# Patient Record
Sex: Male | Born: 1990 | Hispanic: No | Marital: Single | Smoking: Former smoker
Health system: Southern US, Community
[De-identification: ages and names within clinical notes are randomized; demographics above are authoritative.]

## PROBLEM LIST (undated history)

## (undated) DIAGNOSIS — E161 Other hypoglycemia: Secondary | ICD-10-CM

## (undated) DIAGNOSIS — I639 Cerebral infarction, unspecified: Secondary | ICD-10-CM

## (undated) DIAGNOSIS — H548 Legal blindness, as defined in USA: Secondary | ICD-10-CM

## (undated) DIAGNOSIS — Q211 Atrial septal defect: Secondary | ICD-10-CM

## (undated) DIAGNOSIS — E7030 Albinism, unspecified: Secondary | ICD-10-CM

## (undated) DIAGNOSIS — H5500 Unspecified nystagmus: Secondary | ICD-10-CM

## (undated) HISTORY — DX: Albinism, unspecified: E70.30

## (undated) HISTORY — DX: Atrial septal defect: Q21.1

## (undated) HISTORY — DX: Unspecified nystagmus: H55.00

## (undated) HISTORY — DX: Legal blindness, as defined in USA: H54.8

## (undated) HISTORY — DX: Cerebral infarction, unspecified: I63.9

## (undated) HISTORY — PX: EYE SURGERY: SHX253

---
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
## 2010-08-25 ENCOUNTER — Emergency Department (HOSPITAL_COMMUNITY)
Admission: EM | Admit: 2010-08-25 | Discharge: 2010-08-25 | Disposition: A | Discharge status: Home / Self Care | Payer: Medicaid Other | Attending: Emergency Medicine | Admitting: Emergency Medicine

## 2010-08-25 DIAGNOSIS — W219XXA Striking against or struck by unspecified sports equipment, initial encounter: Secondary | ICD-10-CM | POA: Insufficient documentation

## 2010-08-25 DIAGNOSIS — S0003XA Contusion of scalp, initial encounter: Secondary | ICD-10-CM | POA: Insufficient documentation

## 2010-08-25 DIAGNOSIS — Y9367 Activity, basketball: Secondary | ICD-10-CM | POA: Insufficient documentation

## 2010-09-01 LAB — MONONUCLEOSIS SCREEN: Mono Screen: POSITIVE — AB

## 2010-09-01 LAB — RAPID STREP SCREEN (MED CTR MEBANE ONLY): Streptococcus, Group A Screen (Direct): NEGATIVE

## 2012-07-22 ENCOUNTER — Encounter (HOSPITAL_COMMUNITY): Payer: Self-pay | Admitting: *Deleted

## 2012-07-22 ENCOUNTER — Emergency Department (HOSPITAL_COMMUNITY): Payer: Medicaid Other

## 2012-07-22 ENCOUNTER — Emergency Department (HOSPITAL_COMMUNITY)
Admission: EM | Admit: 2012-07-22 | Discharge: 2012-07-22 | Disposition: A | Discharge status: Home / Self Care | Payer: Medicaid Other | Attending: Emergency Medicine | Admitting: Emergency Medicine

## 2012-07-22 DIAGNOSIS — Y9351 Activity, roller skating (inline) and skateboarding: Secondary | ICD-10-CM | POA: Insufficient documentation

## 2012-07-22 DIAGNOSIS — Z8639 Personal history of other endocrine, nutritional and metabolic disease: Secondary | ICD-10-CM | POA: Insufficient documentation

## 2012-07-22 DIAGNOSIS — Z862 Personal history of diseases of the blood and blood-forming organs and certain disorders involving the immune mechanism: Secondary | ICD-10-CM | POA: Insufficient documentation

## 2012-07-22 DIAGNOSIS — Y929 Unspecified place or not applicable: Secondary | ICD-10-CM | POA: Insufficient documentation

## 2012-07-22 DIAGNOSIS — S90129A Contusion of unspecified lesser toe(s) without damage to nail, initial encounter: Secondary | ICD-10-CM | POA: Insufficient documentation

## 2012-07-22 HISTORY — DX: Other hypoglycemia: E16.1

## 2012-07-22 NOTE — ED Notes (Signed)
Pt states he hurt his R foot yesterday skate boarding, bruising noted to inside of R foot.

## 2012-07-22 NOTE — ED Provider Notes (Signed)
Medical screening examination/treatment/procedure(s) were performed by non-physician practitioner and as supervising physician I was immediately available for consultation/collaboration.   Lynasia Meloche E Benito Lemmerman, MD 07/22/12 1956 

## 2012-07-22 NOTE — ED Provider Notes (Signed)
History    This chart was scribed for non-physician practitioner working with Suzi Roots, MD by Melba Coon, ED Scribe. This patient was seen in room WTR9/WTR9 and the patient's care was started at 3:41PM.     CSN: [id number]  Arrival date & time 07/22/12  1407   First MD Initiated Contact with Patient 07/22/12 1514      Chief Complaint  Patient presents with  . Foot Pain    (Consider location/radiation/quality/duration/timing/severity/associated sxs/prior treatment) The history is provided by the patient. No language interpreter was used.   Jeffrey Gilmore is a 22 y.o. male who presents to the Emergency Department complaining of constant, moderate to severe right toe  pain with an onset yesterday pertaining to a fall without head contact or LOC. He reports he fell skateboarding yesterday while trying to jump over 8 stairs; he tweaked his right leg with pain especially around his ankle. He reports all his weight landed on his foot. He is able to move his right ankle. Ambulation is with pain and is decreased compared to baseline. Denies HA, fever, neck pain, sore throat, rash, back pain, CP, SOB, abdominal pain, nausea, emesis, diarrhea, dysuria, or extremity weakness, numbness, or tingling. No known allergies. No other pertinent medical symptoms.  Past Medical History  Diagnosis Date  . Hypoglycemic reaction     Past Surgical History  Procedure Laterality Date  . Eye surgery      History reviewed. No pertinent family history.  History  Substance Use Topics  . Smoking status: Never Smoker   . Smokeless tobacco: Never Used  . Alcohol Use: Yes      Review of Systems 10 Systems reviewed and all are negative for acute change except as noted in the HPI.   Allergies  Amoxicillin  Home Medications   Current Outpatient Rx  Name  Route  Sig  Dispense  Refill  . glucosamine-chondroitin 500-400 MG tablet   Oral   Take 1 tablet by mouth 3 (three) times daily.          Marland Kitchen ibuprofen (ADVIL,MOTRIN) 200 MG tablet   Oral   Take 800 mg by mouth every 6 (six) hours as needed for pain.         . Multiple Vitamin (MULTIVITAMIN WITH MINERALS) TABS   Oral   Take 1 tablet by mouth daily.         . vitamin C (ASCORBIC ACID) 500 MG tablet   Oral   Take 500 mg by mouth daily.           BP 106/70  Pulse 76  Temp(Src) 98.5 F (36.9 C) (Oral)  Resp 18  SpO2 97%  Physical Exam  Nursing note and vitals reviewed. Constitutional: He is oriented to person, place, and time. He appears well-developed and well-nourished. No distress.  HENT:  Head: Normocephalic and atraumatic.  Eyes: EOM are normal.  Neck: Neck supple. No tracheal deviation present.  Cardiovascular: Normal rate.   Pulmonary/Chest: Effort normal. No respiratory distress.  Musculoskeletal: Normal range of motion. He exhibits tenderness.  Bruising to the right great toe without deformity but decreased ROM and mild TTP.  Neurological: He is alert and oriented to person, place, and time.  Skin: Skin is warm and dry.  Psychiatric: He has a normal mood and affect. His behavior is normal.    ED Course  Procedures (including critical care time)  DIAGNOSTIC STUDIES: Oxygen Saturation is 97% on room air, normal by my interpretation.  COORDINATION OF CARE:  3:46PM - Imaging results are reviewed and are unremarkable. Work note will be written for him to stay rest for 2 days and post op boot will be given today as well. He is advised to take ibuprofen at home and he will be ready for d/c.   Labs Reviewed - No data to display Dg Foot Complete Right  07/22/2012  *RADIOLOGY REPORT*  Clinical Data: Foot pain  RIGHT FOOT COMPLETE - 3+ VIEW  Comparison: 05/07/2007  Findings: No fracture or dislocation is seen.  The joint spaces are preserved.  Visualized osseous structures are within normal limits.  IMPRESSION: No evidence of acute cardiopulmonary disease.   Original Report Authenticated By: Charline Bills, M.D.      1. Toe contusion, right, initial encounter       MDM  I personally performed the services described in this documentation, which was scribed in my presence. The recorded information has been reviewed and is accurate.        Dorthula Matas, PA 07/22/12 1640

## 2012-09-30 ENCOUNTER — Encounter (HOSPITAL_COMMUNITY): Payer: Self-pay | Admitting: *Deleted

## 2012-09-30 ENCOUNTER — Emergency Department (HOSPITAL_COMMUNITY): Payer: Medicaid Other

## 2012-09-30 ENCOUNTER — Emergency Department (HOSPITAL_COMMUNITY)
Admission: EM | Admit: 2012-09-30 | Discharge: 2012-10-01 | Disposition: A | Discharge status: Home / Self Care | Payer: Medicaid Other | Attending: Emergency Medicine | Admitting: Emergency Medicine

## 2012-09-30 DIAGNOSIS — Y9351 Activity, roller skating (inline) and skateboarding: Secondary | ICD-10-CM | POA: Insufficient documentation

## 2012-09-30 DIAGNOSIS — Y92838 Other recreation area as the place of occurrence of the external cause: Secondary | ICD-10-CM | POA: Insufficient documentation

## 2012-09-30 DIAGNOSIS — Y9239 Other specified sports and athletic area as the place of occurrence of the external cause: Secondary | ICD-10-CM | POA: Insufficient documentation

## 2012-09-30 DIAGNOSIS — Z880 Allergy status to penicillin: Secondary | ICD-10-CM | POA: Insufficient documentation

## 2012-09-30 DIAGNOSIS — S92109A Unspecified fracture of unspecified talus, initial encounter for closed fracture: Secondary | ICD-10-CM | POA: Insufficient documentation

## 2012-09-30 DIAGNOSIS — Z79899 Other long term (current) drug therapy: Secondary | ICD-10-CM | POA: Insufficient documentation

## 2012-09-30 DIAGNOSIS — S92102A Unspecified fracture of left talus, initial encounter for closed fracture: Secondary | ICD-10-CM

## 2012-09-30 MED ORDER — IBUPROFEN 800 MG PO TABS
800.0000 mg | ORAL_TABLET | Freq: Once | ORAL | Status: AC
  Administered 2012-10-01: 800 mg via ORAL
  Filled 2012-09-30: qty 1

## 2012-09-30 NOTE — ED Notes (Signed)
Pt injured L ankle while skateboarding, presents w/ pain and swelling to L foot and L ankle.

## 2012-09-30 NOTE — ED Provider Notes (Signed)
History     CSN: [id number]  Arrival date & time 09/30/12  2248   First MD Initiated Contact with Patient 09/30/12 2309      Chief Complaint  Patient presents with  . Ankle Injury    (Consider location/radiation/quality/duration/timing/severity/associated sxs/prior treatment) HPI Comments: 22 y.o. Male with no significant medical hx presents today s/p skateboarding accident where he fell, hurting his left foot and left ankle. Pt states did not hit his head. Did not lose consciousness and reports no other somatic complaints. Pt states he was able to wt bear at the scene, went home, iced the injury, elevated it, took some ibuprofen and took a nap. When he awoke, the pain and swelling had increased and he wanted to have it xrayed. Pt states pain is severe, throbbing, worse with weight bearing. Pt denies numbness, nausea, weakness.   Patient is a 22 y.o. male presenting with lower extremity injury.  Ankle Injury Associated symptoms include joint swelling. Pertinent negatives include no numbness or weakness.    Past Medical History  Diagnosis Date  . Hypoglycemic reaction     Past Surgical History  Procedure Laterality Date  . Eye surgery      History reviewed. No pertinent family history.  History  Substance Use Topics  . Smoking status: Never Smoker   . Smokeless tobacco: Never Used  . Alcohol Use: Yes     Comment: weekly      Review of Systems  Musculoskeletal: Positive for joint swelling.  Neurological: Negative for weakness and numbness.    Allergies  Amoxicillin  Home Medications   Current Outpatient Rx  Name  Route  Sig  Dispense  Refill  . glucosamine-chondroitin 500-400 MG tablet   Oral   Take 1 tablet by mouth 3 (three) times daily.         Marland Kitchen ibuprofen (ADVIL,MOTRIN) 200 MG tablet   Oral   Take 800 mg by mouth every 6 (six) hours as needed for pain.         . Multiple Vitamin (MULTIVITAMIN WITH MINERALS) TABS   Oral   Take 1 tablet by mouth  daily.         . vitamin C (ASCORBIC ACID) 500 MG tablet   Oral   Take 500 mg by mouth daily.           BP 120/64  Pulse 80  Temp(Src) 98.4 F (36.9 C) (Oral)  Resp 16  Ht 6\' 1"  (1.854 m)  Wt 183 lb (83.008 kg)  BMI 24.15 kg/m2  SpO2 98%  Physical Exam  Nursing note and vitals reviewed. Constitutional: He is oriented to person, place, and time. He appears well-developed and well-nourished. No distress.  HENT:  Head: Normocephalic and atraumatic.  Eyes: EOM are normal. Pupils are equal, round, and reactive to light.  Neck: Normal range of motion. Neck supple.  No meningeal signs  Cardiovascular: Normal rate, regular rhythm and normal heart sounds.  Exam reveals no gallop and no friction rub.   No murmur heard. Pulmonary/Chest: Effort normal and breath sounds normal. No respiratory distress. He has no wheezes. He has no rales. He exhibits no tenderness.  Abdominal: Soft. Bowel sounds are normal. He exhibits no distension. There is no tenderness. There is no rebound and no guarding.  Musculoskeletal: Normal range of motion. He exhibits no edema and no tenderness.  5/5 strength with exception of left foot. Strength diminished secondary to pain. Swelling to lateral malleolus and lateral dorsal surface of left foot. No  erythema. No warmth. Tenderness to palpation.   Neurological: He is alert and oriented to person, place, and time. No cranial nerve deficit.  Skin: Skin is warm and dry. He is not diaphoretic. No erythema.  Psychiatric: He has a normal mood and affect.    ED Course  Procedures (including critical care time)  Labs Reviewed - No data to display Dg Ankle Complete Left  09/30/2012  *RADIOLOGY REPORT*  Clinical Data: Lateral foot and ankle pain and swelling.  LEFT ANKLE COMPLETE - 3+ VIEW  Comparison: None.  Findings: Soft tissue swelling overlies the lateral malleolus and lateral foot. There is a linear fragment along the anterior margin of the talus superiorly as  seen on the lateral view. Ankle mortise appears intact.  No additional fracture identified.  No dislocation.  IMPRESSION: Fracture along the anterosuperior margin of the talus.  Lateral soft tissue swelling.  If clinical symptoms suggest a more complex injury, can be further evaluated with cross-sectional imaging (CT or MR).   Original Report Authenticated By: Jearld Lesch, M.D.    Dg Foot Complete Left  09/30/2012  *RADIOLOGY REPORT*  Clinical Data: Ankle and foot injury, swelling laterally.  LEFT FOOT - COMPLETE 3+ VIEW  Comparison: None.  Findings: As described on contemporaneous ankle, there is a fracture along the anterior superior margin of the talus.  Lateral soft tissue swelling.  No additional fracture identified.  No dislocation.  IMPRESSION: Anterosuperior fracture with talus.   Original Report Authenticated By: Jearld Lesch, M.D.       1. Talus fracture, left, closed, initial encounter       MDM  PE reveals diffusely swollen left foot and ankle. Neurovascularly intact. Limited ROM. Imaging confirms fracture along the anterosuperior margin of the talus, consistent with point tenderness and swelling. Ankle mortise is intact. Discussed with pt that ortho tech needed to arrive from [facility], so the wait would be a while. Offered vicodin/percocet, but pt declined, preferring ibuprofen. Pt moved to Room 20, a more comfortable place for him to wait. Ortho tech consult appreciated. Posterior stirrup splint to be applied. Crutches to be provided. Pt understands diagnosis and is agreeable to discharge.        Glade Nurse, PA-C 10/01/12 2156

## 2012-10-01 MED ORDER — OXYCODONE-ACETAMINOPHEN 5-325 MG PO TABS: 1.0000 | ORAL_TABLET | ORAL | Status: DC | PRN

## 2012-10-01 NOTE — Progress Notes (Signed)
Orthopedic Tech Progress Note Patient Details:  Jeffrey Gilmore 04/22/1991 [id number]  Ortho Devices Type of Ortho Device: Stirrup splint;Post (short) splint   [name] 10/01/2012, 12:33 AM

## 2012-10-04 NOTE — ED Provider Notes (Signed)
Medical screening examination/treatment/procedure(s) were performed by non-physician practitioner and as supervising physician I was immediately available for consultation/collaboration.   Gilda Crease, MD 10/04/12 1700

## 2014-01-17 ENCOUNTER — Emergency Department (HOSPITAL_COMMUNITY)
Admission: EM | Admit: 2014-01-17 | Discharge: 2014-01-17 | Disposition: A | Discharge status: Home / Self Care | Payer: Medicaid Other | Attending: Emergency Medicine | Admitting: Emergency Medicine

## 2014-01-17 ENCOUNTER — Encounter (HOSPITAL_COMMUNITY): Payer: Self-pay | Admitting: Emergency Medicine

## 2014-01-17 DIAGNOSIS — Z79899 Other long term (current) drug therapy: Secondary | ICD-10-CM | POA: Insufficient documentation

## 2014-01-17 DIAGNOSIS — H5710 Ocular pain, unspecified eye: Secondary | ICD-10-CM | POA: Insufficient documentation

## 2014-01-17 DIAGNOSIS — H00023 Hordeolum internum right eye, unspecified eyelid: Secondary | ICD-10-CM

## 2014-01-17 DIAGNOSIS — Z862 Personal history of diseases of the blood and blood-forming organs and certain disorders involving the immune mechanism: Secondary | ICD-10-CM | POA: Diagnosis not present

## 2014-01-17 DIAGNOSIS — H00029 Hordeolum internum unspecified eye, unspecified eyelid: Secondary | ICD-10-CM | POA: Diagnosis not present

## 2014-01-17 DIAGNOSIS — Z8639 Personal history of other endocrine, nutritional and metabolic disease: Secondary | ICD-10-CM | POA: Insufficient documentation

## 2014-01-17 DIAGNOSIS — Z880 Allergy status to penicillin: Secondary | ICD-10-CM | POA: Insufficient documentation

## 2014-01-17 MED ORDER — ERYTHROMYCIN 5 MG/GM OP OINT
TOPICAL_OINTMENT | Freq: Once | OPHTHALMIC | Status: AC
  Administered 2014-01-17: 1 via OPHTHALMIC
  Filled 2014-01-17: qty 3.5

## 2014-01-17 NOTE — ED Notes (Signed)
Pt started having eye pain yesterday.  Pt woke up today and painful to blink.  Right eyelid swollen and painful.  No change in vision.

## 2014-01-17 NOTE — Discharge Instructions (Signed)
Warm compresses to the eye. Erythromycin ointment, apply 1cm ribbon to the lower eye lod 6 times a day. Return if worsening. Otherwise follow up with your eye doctor.    Sty A sty (hordeolum) is an infection of a gland in the eyelid located at the base of the eyelash. A sty may develop a white or yellow head of pus. It can be puffy (swollen). Usually, the sty will burst and pus will come out on its own. They do not leave lumps in the eyelid once they drain. A sty is often confused with another form of cyst of the eyelid called a chalazion. Chalazions occur within the eyelid and not on the edge where the bases of the eyelashes are. They often are red, sore and then form firm lumps in the eyelid. CAUSES   Germs (bacteria).  Lasting (chronic) eyelid inflammation. SYMPTOMS   Tenderness, redness and swelling along the edge of the eyelid at the base of the eyelashes.  Sometimes, there is a white or yellow head of pus. It may or may not drain. DIAGNOSIS  An ophthalmologist will be able to distinguish between a sty and a chalazion and treat the condition appropriately.  TREATMENT   Styes are typically treated with warm packs (compresses) until drainage occurs.  In rare cases, medicines that kill germs (antibiotics) may be prescribed. These antibiotics may be in the form of drops, cream or pills.  If a hard lump has formed, it is generally necessary to do a small incision and remove the hardened contents of the cyst in a minor surgical procedure done in the office.  In suspicious cases, your caregiver may send the contents of the cyst to the lab to be certain that it is not a rare, but dangerous form of cancer of the glands of the eyelid. HOME CARE INSTRUCTIONS   Wash your hands often and dry them with a clean towel. Avoid touching your eyelid. This may spread the infection to other parts of the eye.  Apply heat to your eyelid for 10 to 20 minutes, several times a day, to ease pain and help to  heal it faster.  Do not squeeze the sty. Allow it to drain on its own. Wash your eyelid carefully 3 to 4 times per day to remove any pus. SEEK IMMEDIATE MEDICAL CARE IF:   Your eye becomes painful or puffy (swollen).  Your vision changes.  Your sty does not drain by itself within 3 days.  Your sty comes back within a short period of time, even with treatment.  You have redness (inflammation) around the eye.  You have a fever. Document Released: 02/16/2005 Document Revised: 08/01/2011 Document Reviewed: 08/23/2013 [facility] Patient Information 2015 [place], [place]. This information is not intended to replace advice given to you by your health care provider. Make sure you discuss any questions you have with your health care provider.

## 2014-01-17 NOTE — ED Provider Notes (Signed)
CSN: [id number]     Arrival date & time 01/17/14  1256 History  This chart was scribed for non-physician practitioner, Jaynie Crumble, PA-C, working with Merrie Roof, *, by Jarvis Morgan, ED Scribe. This patient was seen in room WTR6/WTR6 and the patient's care was started at 2:37 PM.    Chief Complaint  Patient presents with  . Eye Pain      The history is provided by the patient. No language interpreter was used.   HPI Comments: Jeffrey Gilmore is a 23 y.o. male who presents to the Emergency Department complaining of constant, moderate, gradually worsening, "6/10", right eye pain for 2 days. Patient states he woke up and it was painful to move or open his eye. He admits some associated swelling of his right eyelid and mild eye discharge. He notes that pain is exacerbated by blinking. He reports that it feels like something may be in his eye. He admits he may have made it worse by rubbing his eye. He denies any visual disturbance, fever, or URI symptoms.   Past Medical History  Diagnosis Date  . Hypoglycemic reaction    Past Surgical History  Procedure Laterality Date  . Eye surgery     History reviewed. No pertinent family history. History  Substance Use Topics  . Smoking status: Never Smoker   . Smokeless tobacco: Never Used  . Alcohol Use: Yes     Comment: weekly    Review of Systems  Constitutional: Negative for fever.  HENT: Negative for congestion, ear pain, sneezing and sore throat.   Eyes: Positive for pain, discharge and redness. Negative for visual disturbance.       Swelling of right eye lid  Respiratory: Negative for cough.   All other systems reviewed and are negative.     Allergies  Amoxicillin  Home Medications   Prior to Admission medications   Medication Sig Start Date End Date Taking? Authorizing Provider  ibuprofen (ADVIL,MOTRIN) 200 MG tablet Take 800 mg by mouth every 6 (six) hours as needed for pain.    Historical Provider, MD   Multiple Vitamin (MULTIVITAMIN WITH MINERALS) TABS Take 1 tablet by mouth daily.    Historical Provider, MD  oxyCODONE-acetaminophen (PERCOCET) 5-325 MG per tablet Take 1 tablet by mouth every 4 (four) hours as needed for pain. 10/01/12   Glade Nurse, PA-C  vitamin C (ASCORBIC ACID) 500 MG tablet Take 500 mg by mouth daily.    Historical Provider, MD   Triage Vitals: BP 107/65  Pulse 68  Temp(Src) 98.3 F (36.8 C) (Oral)  Resp 16  SpO2 93%  Physical Exam  Nursing note and vitals reviewed. Constitutional: He is oriented to person, place, and time. He appears well-developed and well-nourished. No distress.  HENT:  Head: Normocephalic and atraumatic.  Eyes: Conjunctivae and EOM are normal. Pupils are equal, round, and reactive to light.  Mild swelling to right eyelid, mainly over lateral aspect of the lid. Small pustule to the inverted lateral lid. Conjunctiva normal  Neck: Normal range of motion. Neck supple. No tracheal deviation present.  Cardiovascular: Normal rate.   Pulmonary/Chest: Effort normal. No respiratory distress.  Musculoskeletal: Normal range of motion.  Neurological: He is alert and oriented to person, place, and time.  Skin: Skin is warm and dry.  Psychiatric: He has a normal mood and affect. His behavior is normal.    ED Course  Procedures (including critical care time)  DIAGNOSTIC STUDIES: Oxygen Saturation is 93% on RA, adequate  by my interpretation.    COORDINATION OF CARE: 2:54 PM- Advised pt to return if symptoms or swelling get worse. Advised him to f/u with eye doctor. Will discharge pt with erythromycin opthalmic ointment.  Pt advised of plan for treatment and pt agrees.     Labs Review Labs Reviewed - No data to display  Imaging Review No results found.   EKG Interpretation None      MDM   Final diagnoses:  None   Pt with swelling of lateral eyelid, most likely due to a hordeolum. conjunctiva normal. Home with warm compresses,  erythromycin ointment. Follow up with his ophthalmologist.    Ceasar Mons Vitals:   01/17/14 1316 01/17/14 1522  BP: 107/65 108/74  Pulse: 68 67  Temp: 98.3 F (36.8 C)   TempSrc: Oral   Resp: 16 17  SpO2: 93% 100%   I personally performed the services described in this documentation, which was scribed in my presence. The recorded information has been reviewed and is accurate.    Lottie Mussel, PA-C 01/17/14 1735

## 2014-01-17 NOTE — ED Notes (Signed)
Given abx eye ointment and given specific instructions to call opthalmology today for appt set up. AVS explained in detail. No other questions/concerns.

## 2014-01-21 NOTE — ED Provider Notes (Signed)
Medical screening examination/treatment/procedure(s) were performed by non-physician practitioner and as supervising physician I was immediately available for consultation/collaboration.   EKG Interpretation None        Kennedy Bohanon David Torion Hulgan III, MD 01/21/14 1708 

## 2014-07-23 ENCOUNTER — Encounter (HOSPITAL_COMMUNITY): Payer: Self-pay

## 2014-07-23 ENCOUNTER — Emergency Department (HOSPITAL_COMMUNITY)
Admission: EM | Admit: 2014-07-23 | Discharge: 2014-07-23 | Disposition: A | Discharge status: Home / Self Care | Payer: Medicaid Other | Attending: Emergency Medicine | Admitting: Emergency Medicine

## 2014-07-23 ENCOUNTER — Emergency Department (HOSPITAL_COMMUNITY): Payer: Medicaid Other

## 2014-07-23 DIAGNOSIS — J069 Acute upper respiratory infection, unspecified: Secondary | ICD-10-CM | POA: Insufficient documentation

## 2014-07-23 DIAGNOSIS — R112 Nausea with vomiting, unspecified: Secondary | ICD-10-CM | POA: Diagnosis not present

## 2014-07-23 DIAGNOSIS — Z8639 Personal history of other endocrine, nutritional and metabolic disease: Secondary | ICD-10-CM | POA: Insufficient documentation

## 2014-07-23 DIAGNOSIS — Z880 Allergy status to penicillin: Secondary | ICD-10-CM | POA: Insufficient documentation

## 2014-07-23 DIAGNOSIS — R51 Headache: Secondary | ICD-10-CM | POA: Diagnosis not present

## 2014-07-23 DIAGNOSIS — R52 Pain, unspecified: Secondary | ICD-10-CM | POA: Diagnosis present

## 2014-07-23 MED ORDER — PROMETHAZINE-DM 6.25-15 MG/5ML PO SYRP: 5.0000 mL | ORAL_SOLUTION | Freq: Four times a day (QID) | ORAL | Status: DC | PRN

## 2014-07-23 MED ORDER — DOXYCYCLINE HYCLATE 100 MG PO CAPS: 100.0000 mg | ORAL_CAPSULE | Freq: Two times a day (BID) | ORAL | Status: DC

## 2014-07-23 MED ORDER — GUAIFENESIN ER 1200 MG PO TB12: 1.0000 | ORAL_TABLET | Freq: Two times a day (BID) | ORAL | Status: DC

## 2014-07-23 MED ORDER — ONDANSETRON HCL 4 MG/2ML IJ SOLN
4.0000 mg | Freq: Once | INTRAMUSCULAR | Status: AC
  Administered 2014-07-23: 4 mg via INTRAVENOUS
  Filled 2014-07-23: qty 2

## 2014-07-23 MED ORDER — SODIUM CHLORIDE 0.9 % IV BOLUS (SEPSIS)
1000.0000 mL | Freq: Once | INTRAVENOUS | Status: AC
  Administered 2014-07-23: 1000 mL via INTRAVENOUS

## 2014-07-23 MED ORDER — KETOROLAC TROMETHAMINE 30 MG/ML IJ SOLN
30.0000 mg | Freq: Once | INTRAMUSCULAR | Status: AC
  Administered 2014-07-23: 30 mg via INTRAVENOUS
  Filled 2014-07-23: qty 1

## 2014-07-23 NOTE — Discharge Instructions (Signed)
Your chest x-ray did not show any pneumonia.  However, we are giving you some antibiotics.  Return here as needed.  Increase your fluid intake and rest as much as possible

## 2014-07-23 NOTE — ED Provider Notes (Signed)
CSN: [id number]     Arrival date & time 07/23/14  1405 History   First MD Initiated Contact with Patient 07/23/14 1437     Chief Complaint  Patient presents with  . Generalized Body Aches  . Headache   Patient is a 24 y.o. male presenting with headaches. The history is provided by the patient. No language interpreter was used.  Headache Associated symptoms: cough, myalgias, nausea, photophobia and vomiting    This chart was scribed for non-physician practitioner Ebbie Ridgehris Aiman Sonn, PA-C, working with Toy BakerAnthony T Allen, MD, by Andrew Auaven Small, ED Scribe. This patient was seen in room WTR9/WTR9 and the patient's care was started at 2:39 PM.  Jeffrey Gilmore is a 24 y.o. male who presents to the Emergency Department complaining of generalized body aches. Pt reports associated nausea with some emesis, chest discomfort, HA, photophobia and cough. Pt has had sick contacts including both parents who were seen here 4 days ago. Pt had been caring for parents while they were sick. Pt denies chronic medical problems.  Past Medical History  Diagnosis Date  . Hypoglycemic reaction    Past Surgical History  Procedure Laterality Date  . Eye surgery     History reviewed. No pertinent family history. History  Substance Use Topics  . Smoking status: Never Smoker   . Smokeless tobacco: Never Used  . Alcohol Use: Yes     Comment: weekly    Review of Systems  Eyes: Positive for photophobia.  Respiratory: Positive for cough.   Gastrointestinal: Positive for nausea and vomiting.  Musculoskeletal: Positive for myalgias.  Neurological: Positive for headaches.   Allergies  Amoxicillin  Home Medications   Prior to Admission medications   Medication Sig Start Date End Date Taking? Authorizing Provider  ibuprofen (ADVIL,MOTRIN) 200 MG tablet Take 800 mg by mouth every 6 (six) hours as needed for pain.    Historical Provider, MD  Multiple Vitamin (MULTIVITAMIN WITH MINERALS) TABS Take 1 tablet by mouth daily.     Historical Provider, MD  OVER THE COUNTER MEDICATION Apply 3 drops to eye once as needed (dry eyes.).    Historical Provider, MD  vitamin C (ASCORBIC ACID) 500 MG tablet Take 500 mg by mouth daily.    Historical Provider, MD   BP 120/62 mmHg  Pulse 100  Temp(Src) 98.3 F (36.8 C) (Oral)  Resp 21  SpO2 96% Physical Exam  Constitutional: He is oriented to person, place, and time. He appears well-developed and well-nourished. No distress.  HENT:  Head: Normocephalic and atraumatic.  Mouth/Throat: Oropharynx is clear and moist.  Eyes: Conjunctivae are normal. Pupils are equal, round, and reactive to light.  Neck: Normal range of motion. Neck supple.  Cardiovascular: Normal rate, regular rhythm and normal heart sounds.   Pulmonary/Chest: Effort normal and breath sounds normal. No respiratory distress.  Musculoskeletal: Normal range of motion.  Neurological: He is alert and oriented to person, place, and time.  Skin: Skin is warm and dry.  Psychiatric: He has a normal mood and affect. His behavior is normal.  Nursing note and vitals reviewed.   ED Course  Procedures (including critical care time) DIAGNOSTIC STUDIES: Oxygen Saturation is 96% on RA, normal by my interpretation.    COORDINATION OF CARE: 2:57 PM- Pt advised of plan for treatment and pt agrees.  Patient be treated for a upper respiratory infection.  Told to follow-up with his primary care doctor Imaging Review Dg Chest 2 View  07/23/2014   CLINICAL DATA:  Generalized body  aches. Nausea, vomiting, chest discomfort, cough.  EXAM: CHEST  2 VIEW  COMPARISON:  07/25/2003  FINDINGS: The heart size and mediastinal contours are within normal limits. Both lungs are clear. The visualized skeletal structures are unremarkable.  IMPRESSION: No active cardiopulmonary disease.   Electronically Signed   By: Elige Ko   On: 07/23/2014 15:38    Told to increase his fluid intake and rest as much as possible  Carlyle Dolly,  PA-C 07/23/14 1553  Richardean Canal, MD 07/23/14 1755

## 2014-07-23 NOTE — ED Notes (Signed)
Mother and father both dx with pneumonia in last week.  Pt has cough with nausea and body aches since this morning

## 2014-09-28 ENCOUNTER — Encounter (HOSPITAL_COMMUNITY): Payer: Self-pay

## 2014-09-28 ENCOUNTER — Inpatient Hospital Stay (HOSPITAL_COMMUNITY)
Admission: EM | Admit: 2014-09-28 | Discharge: 2014-09-30 | DRG: 065 | Disposition: A | Discharge status: Home / Self Care | Payer: Medicaid Other | Attending: Internal Medicine | Admitting: Internal Medicine

## 2014-09-28 ENCOUNTER — Emergency Department (HOSPITAL_COMMUNITY): Admission: EM | Admit: 2014-09-28 | Payer: Medicaid Other | Source: Home / Self Care

## 2014-09-28 DIAGNOSIS — G459 Transient cerebral ischemic attack, unspecified: Secondary | ICD-10-CM

## 2014-09-28 DIAGNOSIS — I639 Cerebral infarction, unspecified: Principal | ICD-10-CM | POA: Diagnosis present

## 2014-09-28 DIAGNOSIS — F1290 Cannabis use, unspecified, uncomplicated: Secondary | ICD-10-CM | POA: Diagnosis present

## 2014-09-28 DIAGNOSIS — I63411 Cerebral infarction due to embolism of right middle cerebral artery: Secondary | ICD-10-CM

## 2014-09-28 DIAGNOSIS — R29810 Facial weakness: Secondary | ICD-10-CM | POA: Diagnosis present

## 2014-09-28 DIAGNOSIS — E785 Hyperlipidemia, unspecified: Secondary | ICD-10-CM | POA: Insufficient documentation

## 2014-09-28 DIAGNOSIS — R200 Anesthesia of skin: Secondary | ICD-10-CM | POA: Insufficient documentation

## 2014-09-28 DIAGNOSIS — R4781 Slurred speech: Secondary | ICD-10-CM | POA: Diagnosis present

## 2014-09-28 DIAGNOSIS — Q211 Atrial septal defect: Secondary | ICD-10-CM

## 2014-09-28 DIAGNOSIS — F1210 Cannabis abuse, uncomplicated: Secondary | ICD-10-CM | POA: Insufficient documentation

## 2014-09-28 DIAGNOSIS — E7030 Albinism, unspecified: Secondary | ICD-10-CM | POA: Diagnosis present

## 2014-09-28 NOTE — ED Provider Notes (Signed)
CSN: [id number]     Arrival date & time 09/28/14  2143 History   First MD Initiated Contact with Patient 09/28/14 2213     Chief Complaint  Patient presents with  . Hand numbness      (Consider location/radiation/quality/duration/timing/severity/associated sxs/prior Treatment) HPI Patient awakened 8:45 PM tonight from a nap feeling numb in his left hand and having difficulty moving his left hand and also had slurring of his speech. He felt well upon laying down for his nap at 7:45 PM Symptoms lasted approximately one hour, resolved spontaneously. He is presently asymptomatic. No treatment prior to coming here. He admits to smoking marijuana earlier today. Nothing made symptoms better or worse. No other associated symptoms. Past Medical History  Diagnosis Date  . Hypoglycemic reaction    Past Surgical History  Procedure Laterality Date  . Eye surgery     No family history on file.  History  Substance Use Topics  . Smoking status: Never Smoker   . Smokeless tobacco: Never Used  . Alcohol Use: Yes     Comment: occasionally    smokes marijuana,."VAPES", does not smoke tobacco  Review of Systems  Constitutional: Negative.   HENT: Negative.   Eyes:       Chronic nystagmus  Respiratory: Negative.   Cardiovascular: Negative.   Gastrointestinal: Negative.   Musculoskeletal: Negative.   Skin: Negative.   Neurological: Positive for numbness.       Numbness around mouth and at left hand, resolved  Psychiatric/Behavioral: Negative.   All other systems reviewed and are negative.     Allergies  Amoxicillin  Home Medications   Prior to Admission medications   Medication Sig Start Date End Date Taking? Authorizing Provider  ibuprofen (ADVIL,MOTRIN) 200 MG tablet Take 800 mg by mouth every 6 (six) hours as needed for pain.   Yes Historical Provider, MD  doxycycline (VIBRAMYCIN) 100 MG capsule Take 1 capsule (100 mg total) by mouth 2 (two) times daily. Patient not taking: Reported  on 09/28/2014 07/23/14   Charlestine Nighthristopher Lawyer, PA-C  Guaifenesin 1200 MG TB12 Take 1 tablet (1,200 mg total) by mouth 2 (two) times daily. Patient not taking: Reported on 09/28/2014 07/23/14   Charlestine Nighthristopher Lawyer, PA-C  promethazine-dextromethorphan (PROMETHAZINE-DM) 6.25-15 MG/5ML syrup Take 5 mLs by mouth 4 (four) times daily as needed for cough. Patient not taking: Reported on 09/28/2014 07/23/14   Charlestine Nighthristopher Lawyer, PA-C   BP 137/65 mmHg  Pulse 68  Temp(Src) 97.8 F (36.6 C) (Oral)  Resp 18  SpO2 98% Physical Exam  Constitutional: He appears well-developed and well-nourished.  HENT:  Head: Normocephalic and atraumatic.  Right Ear: External ear normal.  Left Ear: External ear normal.  No facial asymmetry  Eyes: Conjunctivae are normal. Pupils are equal, round, and reactive to light.  Horizontal nystagmus  Neck: Neck supple. No tracheal deviation present. No thyromegaly present.  No bruit  Cardiovascular: Normal rate and regular rhythm.  Exam reveals no friction rub.   No murmur heard. Pulmonary/Chest: Effort normal and breath sounds normal.  Abdominal: Soft. Bowel sounds are normal. He exhibits no distension. There is no tenderness.  Musculoskeletal: Normal range of motion. He exhibits no edema or tenderness.  Neurological: He is alert. No cranial nerve deficit. Coordination normal.  Gait normal Romberg normal pronator drift normal finger to nose normal DTR symmetric bilaterally knee jerk ankle jerk biceps toes or going bilaterally  Skin: Skin is warm and dry. No rash noted.  Psychiatric: He has a normal mood and affect.  Nursing note and vitals reviewed.   ED Course  Procedures (including critical care time) Labs Review Labs Reviewed - No data to display  Imaging Review No results found.   EKG Interpretation None      MDM  Patient has no risk factors for stroke. MRI scan of brain is ordered. [facility] by his mother, to be  reevaluated by emergency physician.Dr. Juleen ChinaKohut is accepting MD .the patient asymptomatic on transfer Final diagnoses:  None   dx numbness and weakness of left hand with dysarthria  Differntial VH:QIONGEXBx:Migraine equivalent, TIA      Doug SouSam Rosia Syme, MD 09/28/14 2313

## 2014-09-28 NOTE — ED Notes (Signed)
Pt presents with family with c/o left hand numbness that started approx a hour and a half ago. Pt reports that around this time, he was unable to feel his left hand although he was attempting to make a fist. Pt reports he felt like his mouth was numb and his lips were not moving but he was attempting to talk. Pt has no deficits at this time, hand numbness has resolved as well. Alert and oriented, talking in complete sentences.

## 2014-09-28 NOTE — ED Notes (Signed)
Pt asymptomatic at present time. Family remains with pt.

## 2014-09-29 ENCOUNTER — Emergency Department (HOSPITAL_COMMUNITY): Payer: Medicaid Other

## 2014-09-29 ENCOUNTER — Inpatient Hospital Stay (HOSPITAL_COMMUNITY): Payer: Medicaid Other

## 2014-09-29 ENCOUNTER — Ambulatory Visit (HOSPITAL_COMMUNITY): Payer: Medicaid Other

## 2014-09-29 ENCOUNTER — Encounter (HOSPITAL_COMMUNITY): Payer: Self-pay | Admitting: Internal Medicine

## 2014-09-29 DIAGNOSIS — R200 Anesthesia of skin: Secondary | ICD-10-CM | POA: Diagnosis not present

## 2014-09-29 DIAGNOSIS — F1290 Cannabis use, unspecified, uncomplicated: Secondary | ICD-10-CM | POA: Diagnosis present

## 2014-09-29 DIAGNOSIS — I639 Cerebral infarction, unspecified: Secondary | ICD-10-CM | POA: Diagnosis present

## 2014-09-29 DIAGNOSIS — E785 Hyperlipidemia, unspecified: Secondary | ICD-10-CM | POA: Diagnosis present

## 2014-09-29 DIAGNOSIS — I63411 Cerebral infarction due to embolism of right middle cerebral artery: Secondary | ICD-10-CM | POA: Diagnosis not present

## 2014-09-29 DIAGNOSIS — F1210 Cannabis abuse, uncomplicated: Secondary | ICD-10-CM | POA: Diagnosis not present

## 2014-09-29 DIAGNOSIS — R29810 Facial weakness: Secondary | ICD-10-CM | POA: Diagnosis present

## 2014-09-29 DIAGNOSIS — E7030 Albinism, unspecified: Secondary | ICD-10-CM | POA: Diagnosis present

## 2014-09-29 DIAGNOSIS — R4781 Slurred speech: Secondary | ICD-10-CM | POA: Diagnosis present

## 2014-09-29 DIAGNOSIS — Q211 Atrial septal defect: Secondary | ICD-10-CM | POA: Diagnosis not present

## 2014-09-29 LAB — BASIC METABOLIC PANEL
Anion gap: 8 (ref 5–15)
BUN: 12 mg/dL (ref 6–20)
CALCIUM: 9.2 mg/dL (ref 8.9–10.3)
CO2: 25 mmol/L (ref 22–32)
CREATININE: 0.99 mg/dL (ref 0.61–1.24)
Chloride: 105 mmol/L (ref 101–111)
Glucose, Bld: 92 mg/dL (ref 70–99)
POTASSIUM: 3.8 mmol/L (ref 3.5–5.1)
Sodium: 138 mmol/L (ref 135–145)

## 2014-09-29 LAB — RAPID URINE DRUG SCREEN, HOSP PERFORMED
Amphetamines: NOT DETECTED
Barbiturates: NOT DETECTED
Benzodiazepines: NOT DETECTED
COCAINE: NOT DETECTED
Opiates: NOT DETECTED
TETRAHYDROCANNABINOL: NOT DETECTED

## 2014-09-29 LAB — CBC WITH DIFFERENTIAL/PLATELET
BASOS ABS: 0.1 10*3/uL (ref 0.0–0.1)
BASOS PCT: 1 % (ref 0–1)
EOS ABS: 0.4 10*3/uL (ref 0.0–0.7)
Eosinophils Relative: 5 % (ref 0–5)
HCT: 41.8 % (ref 39.0–52.0)
Hemoglobin: 13.9 g/dL (ref 13.0–17.0)
Lymphocytes Relative: 27 % (ref 12–46)
Lymphs Abs: 2.2 10*3/uL (ref 0.7–4.0)
MCH: 30.8 pg (ref 26.0–34.0)
MCHC: 33.3 g/dL (ref 30.0–36.0)
MCV: 92.5 fL (ref 78.0–100.0)
MONO ABS: 0.8 10*3/uL (ref 0.1–1.0)
Monocytes Relative: 10 % (ref 3–12)
NEUTROS ABS: 4.6 10*3/uL (ref 1.7–7.7)
NEUTROS PCT: 57 % (ref 43–77)
PLATELETS: 212 10*3/uL (ref 150–400)
RBC: 4.52 MIL/uL (ref 4.22–5.81)
RDW: 12.9 % (ref 11.5–15.5)
WBC: 8.1 10*3/uL (ref 4.0–10.5)

## 2014-09-29 LAB — ANTITHROMBIN III: ANTITHROMB III FUNC: 93 % (ref 75–120)

## 2014-09-29 LAB — SEDIMENTATION RATE: Sed Rate: 2 mm/hr (ref 0–16)

## 2014-09-29 MED ORDER — ASPIRIN 300 MG RE SUPP: 300.0000 mg | Freq: Every day | RECTAL | Status: DC

## 2014-09-29 MED ORDER — IOHEXOL 350 MG/ML SOLN
50.0000 mL | Freq: Once | INTRAVENOUS | Status: AC | PRN
  Administered 2014-09-29: 50 mL via INTRAVENOUS

## 2014-09-29 MED ORDER — STROKE: EARLY STAGES OF RECOVERY BOOK
Freq: Once | Status: AC
  Administered 2014-09-29: 09:00:00

## 2014-09-29 MED ORDER — ASPIRIN 325 MG PO TABS
325.0000 mg | ORAL_TABLET | Freq: Every day | ORAL | Status: DC
  Administered 2014-09-29: 325 mg via ORAL
  Filled 2014-09-29: qty 1

## 2014-09-29 MED ORDER — SENNOSIDES-DOCUSATE SODIUM 8.6-50 MG PO TABS: 1.0000 | ORAL_TABLET | Freq: Every evening | ORAL | Status: DC | PRN

## 2014-09-29 MED ORDER — SODIUM CHLORIDE 0.9 % IV SOLN
INTRAVENOUS | Status: DC
  Administered 2014-09-29: 23:00:00 via INTRAVENOUS

## 2014-09-29 MED ORDER — SODIUM CHLORIDE 0.9 % IV SOLN: INTRAVENOUS | Status: AC

## 2014-09-29 MED ORDER — ENOXAPARIN SODIUM 30 MG/0.3ML ~~LOC~~ SOLN
30.0000 mg | SUBCUTANEOUS | Status: DC
  Administered 2014-09-29: 30 mg via SUBCUTANEOUS
  Filled 2014-09-29: qty 0.3

## 2014-09-29 MED ORDER — ASPIRIN EC 81 MG PO TBEC: 81.0000 mg | DELAYED_RELEASE_TABLET | Freq: Every day | ORAL | Status: DC

## 2014-09-29 MED ORDER — ENOXAPARIN SODIUM 30 MG/0.3ML ~~LOC~~ SOLN: 30.0000 mg | SUBCUTANEOUS | Status: DC

## 2014-09-29 NOTE — ED Provider Notes (Signed)
CSN: [id number]     Arrival date & time    History   None   This chart was scribed for Mirian MoMatthew Gentry, MD by Arlan OrganAshley Leger, ED Scribe. This patient was seen in room C29C/C29C and the patient's care was started 12:04 AM.    No chief complaint on file.  The history is provided by the patient. No language interpreter was used.    HPI Comments: Gregary Signsory D Wuthrich is a 24 y.o. 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 Pt states he woke up from a nap earlier this evening and went to speak to his brother in the living room. Pt states he then noticed numbness to his L hand. When he went to grab a cup of water, he immediately dropped the cup as if he did not have a grip on the cup. Mother states when she came to pick him up she noticed a difference in his speech and the L sided facial droop. He denies any arm or leg weakness during episode. No previous history of same. Pt states he has not returned back to baseline and both symptoms have now resolved. PSHx includes eye surgery which resulted in some changes in his vision. Pt with known allergy to Amoxicillin.  Past Medical History  Diagnosis Date  . Hypoglycemic reaction    Past Surgical History  Procedure Laterality Date  . Eye surgery    . Tee without cardioversion N/A 09/30/2014    Procedure: TRANSESOPHAGEAL ECHOCARDIOGRAM (TEE);  Surgeon: Wendall StadePeter C Nishan, MD;  [facility];  Service: Cardiovascular;  Laterality: N/A;   No family history on file. History  Substance Use Topics  . Smoking status: Never Smoker   . Smokeless tobacco: Never Used  . Alcohol Use: Yes     Comment: occasionally     Review of Systems  Constitutional: Negative for fever and chills.  Skin: Negative for rash.  Neurological: Positive for weakness and numbness.  Psychiatric/Behavioral: Negative  for confusion.  All other systems reviewed and are negative.     Allergies  Amoxicillin  Home Medications   Prior to Admission medications   Medication Sig Start Date End Date Taking? Authorizing Provider  aspirin EC 81 MG EC tablet Take 1 tablet (81 mg total) by mouth daily. 09/30/14   Maryann Mikhail, DO  senna-docusate (SENOKOT-S) 8.6-50 MG per tablet Take 1 tablet by mouth at bedtime as needed for mild constipation. 09/30/14   Maryann Mikhail, DO  simvastatin (ZOCOR) 10 MG tablet Take 1 tablet (10 mg total) by mouth daily at 6 PM. 09/30/14   Maryann Mikhail, DO   Triage Vitals: BP 104/49 mmHg  Pulse 61  Temp(Src) 97.7 F (36.5 C) (Oral)  Resp 16  SpO2 97%  Physical Exam  Constitutional: He is oriented to person, place, and time. He appears well-developed and well-nourished.  HENT:  Head: Normocephalic and atraumatic.  Eyes: Conjunctivae and EOM are normal.  Neck: Normal range of motion. Neck supple.  Cardiovascular: Normal rate, regular rhythm and normal heart sounds.   Pulmonary/Chest: Effort normal and breath sounds normal. No respiratory distress.  Abdominal: He exhibits no distension. There is no tenderness. There is no rebound and no guarding.  Musculoskeletal: Normal range of motion.  Neurological: He is alert and oriented to person, place, and time. He has normal strength. No cranial  nerve deficit or sensory deficit. Coordination normal.  Skin: Skin is warm and dry.  Vitals reviewed.   ED Course  Procedures (including critical care time)  DIAGNOSTIC STUDIES: Oxygen Saturation is 97% on RA, adequate by my interpretation.    COORDINATION OF CARE: 12:19 AM-Discussed treatment plan with pt at bedside and pt agreed to plan.     Labs Review Labs Reviewed - No data to display  Imaging Review No results found.   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  On my assumption of care, pt without symptoms.  MR with new CVA.  Consulted neurology and pt admitted.    I have reviewed all laboratory and imaging studies if ordered as above  1. Weakness           Mirian MoMatthew Gentry, MD 10/06/14 [phone]

## 2014-09-29 NOTE — Progress Notes (Signed)
STROKE TEAM PROGRESS NOTE   HISTORY Jeffrey Gilmore is an 24 y.o. male with a past medical history significant for albinism and hypoglycemic reaction, who was in his usual state of health until 9 pm yesterday 09/28/2014 when suddenly developed difficulty speaking, face-mouth numbness (can not pinpoint if left or right side) and weakness of the left hand. He said that he woke up from a nap and later on developed such symptoms. He became very scared and went to his mother room who noticed that his face was drooped and couldn't speak fluently. Mother said that he last saw him normal around 2 pm 09/28/2014 when he left the house to meet some friends and he told her that he smoked some marijuana. His symptoms lasted approximately 45 minutes and completely resolved. Denies recent head or neck trauma, HA, vertigo, double vision, difficulty swallowing, unsteadiness, confusion, or vision impairment. MRI brain was reviewed and demonstrated a small acute infarction involving the cortical gray matter and underlying white matter of the right frontal lobe. Patient was not administered TPA secondary to being out of the window, symptoms resolved. He was admitted for further evaluation and treatment.   SUBJECTIVE (INTERVAL HISTORY) His mother and aunt are at the bedside.  Overall he feels his condition is stable. Reports drinking power drinks, red bull, but not to excess. Not over 1 per day. Admits to marijuana use. Denies other drug use. Mom stated that he went to a party smoking marijuana, came back had a nap, after the nap he started has symptoms lasting 45 minutes. [name], he started to have headache, 6/10, throbbing, denies any nausea vomiting. Headache resolved.   OBJECTIVE Temp:  [97.5 F (36.4 C)-98.5 F (36.9 C)] 97.9 F (36.6 C) (05/09 0800) Pulse Rate:  [53-69] 64 (05/09 1000) Cardiac Rhythm:  [-]  Resp:  [16-18] 16 (05/09 1000) BP: (90-137)/(39-71) 107/56 mmHg (05/09 1000) SpO2:  [94 %-99 %]  95 % (05/09 1000)  No results for input(s): GLUCAP in the last 168 hours.  Recent Labs Lab 09/29/14 0426  NA 138  K 3.8  CL 105  CO2 25  GLUCOSE 92  BUN 12  CREATININE 0.99  CALCIUM 9.2   No results for input(s): AST, ALT, ALKPHOS, BILITOT, PROT, ALBUMIN in the last 168 hours.  Recent Labs Lab 09/29/14 0426  WBC 8.1  NEUTROABS 4.6  HGB 13.9  HCT 41.8  MCV 92.5  PLT 212   No results for input(s): CKTOTAL, CKMB, CKMBINDEX, TROPONINI in the last 168 hours. No results for input(s): LABPROT, INR in the last 72 hours. No results for input(s): COLORURINE, LABSPEC, PHURINE, GLUCOSEU, HGBUR, BILIRUBINUR, KETONESUR, PROTEINUR, UROBILINOGEN, NITRITE, LEUKOCYTESUR in the last 72 hours.  Invalid input(s): APPERANCEUR  No results found for: CHOL, TRIG, HDL, CHOLHDL, VLDL, LDLCALC No results found for: HGBA1C No results found for: LABOPIA, COCAINSCRNUR, LABBENZ, AMPHETMU, THCU, LABBARB  No results for input(s): ETH in the last 168 hours.   Dg Chest 2 View 09/29/2014   No active cardiopulmonary disease.     Mr Brain Wo Contrast 09/29/2014   1. 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 No associated hemorrhage or mass effect. 2. Otherwise normal brain MRI.   Ct Angio Head and neck W/cm &/or Wo Cm 09/29/2014   IMPRESSION: Right frontal lobe infarct better demonstrated on recent MR.  No medium or large size vessel significant stenosis. No obvious  changes of vasculitis (subtle branch vessel vasculitis changes may not be detected by CT angiography).  Slightly prominent vessels adjacent to the cavernous sinus region greater on right may be related to the late phase of injection rather than vascular malformation. To help confirm this and exclude underlying vascular malformation, MR angiogram and MR venography can be obtained.  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 Prominent lymphoid tissue may be reactive in origin related to recent infection. More worrisome process such as lymphoma felt to be unlikely in this setting of normal CBC and differential.     LE venous Doppler - negative for DVT  2D echo - pending  TEE pending  Cerebral angio - pending  PHYSICAL EXAM  Temp:  [97.5 F (36.4 C)-98.5 F (36.9 C)] 98.1 F (36.7 C) (05/09 1200) Pulse Rate:  [53-77] 77 (05/09 1200) Resp:  [16-18] 16 (05/09 1200) BP: (90-137)/(39-73) 112/73 mmHg (05/09 1200) SpO2:  [94 %-99 %] 97 % (05/09 1200)  General - Well nourished, well developed, in no apparent distress.  Ophthalmologic - Sharp disc margins OU.   Cardiovascular - Regular rate and rhythm with no murmur.  Mental Status -  Level of arousal and orientation to time, place, and person were intact. Language including expression, naming, repetition, comprehension was assessed and found intact. Attention span and concentration were normal. Recent and remote memory were intact. Fund of Knowledge was assessed and was intact.  Cranial Nerves II - XII - II - Visual field intact OU. III, IV, VI - Extraocular movements intact. V - Facial sensation intact bilaterally. VII - Facial movement intact bilaterally. VIII - Hearing & vestibular intact bilaterally. X - Palate elevates symmetrically. XI - Chin turning & shoulder shrug intact bilaterally. XII - Tongue protrusion intact.  Motor Strength - The patient's strength was normal in all extremities and pronator drift was absent.  Bulk was normal and fasciculations were absent.   Motor Tone - Muscle tone was assessed at the neck and appendages and was normal.  Reflexes - The patient's reflexes were 1+ in all extremities and he had no pathological reflexes.  Sensory - Light touch, temperature/pinprick, vibration and proprioception, and Romberg testing were assessed and were symmetrical.    Coordination - The patient had normal movements in the  hands and feet with no ataxia or dysmetria.  Tremor was absent.  Gait and Station - deferred due to fatigue.   ASSESSMENT/PLAN Jeffrey Gilmore is a 24 y.o. male with history of albinism and hypoglycemic reaction presenting with transient dysarthria, left face numbness-weakness, left hand weakness. He did not receive IV t-PA due to delay in arrival, symptoms resolved.   Stroke:  MRI read as Non-dominant right frontal lobe linear-shaped infarct, etiology unclear, DDx includes cardioembolic, paradoxical emboli, RVCS, vasculitis, substance related.  Resultant  Neuro deficits resolved  MRI  Read as R frontal lobe linear-shaped  infarct  2D Echo  pending   LE venous doppler no DVT  UDS negative   Ordered Cerebral angiogram to rule out AV fistula or vasculitis  Ordered Hypercoagulable, vasculitic and array of labs to look for possible stroke source  TEE to look for embolic source. Arranged with [facility] for tomorrow.  (I have made patient NPO after midnight tonight).   LDL pending   HgbA1c pending  Lovenox 30 mg sq daily for VTE prophylaxis Diet regular Room service appropriate?: Yes; Fluid consistency:: Thin  no antithrombotic prior to admission, now on aspirin 325  mg orally every day.   Ongoing aggressive stroke risk factor management  Therapy recommendations:  Pending. No needs anticipated  Disposition:  Return home  Other Stroke Risk Factors  ETOH use  Marijuana use.   UDS negative  Hospital day # 0  Jeffrey Gilmore,Jeffrey Gilmore  [facility] See Amion for Pager information 09/29/2014 12:35 PM   I, the attending vascular neurologist, have personally obtained a history, examined the patient, evaluated laboratory data, individually viewed imaging studies and agree with radiology interpretations. I also obtained additional history from pt's mom at bedside. I also discussed with Dr. Catha GosselinMikhail regarding his care plan. Together with the NP/PA, we  formulated the assessment and plan of care which reflects our mutual decision.  I have made any additions or clarifications directly to the above note and agree with the findings and plan as currently documented.   A 24 year old male with no significant past medical history presented with transient slurred speech, left hand and face numbness and weakness. Symptoms resolved. MRI showed right frontal linear-shaped infarct. Patient stated that he used marijuana before the episode, however UDS negative so far. Due to young age, would like to have full workup including TEE, cerebral angiogram, hypercoagulable and autoimmune workup. DDX include cardioembolic, paradoxical emboli, RVCS, vasculitis and substance related. Continue aspirin.  Jeffrey PlanJindong Ezio Wieck, MD PhD Stroke Neurology 09/29/2014 4:16 PM    To contact Stroke Continuity provider, please refer to [email or web address]. After hours, contact General Neurology

## 2014-09-29 NOTE — Consult Note (Signed)
Referring Physician: ED    Chief Complaint: transient dysarthria, left face numbness-weakness, left hand weakness, stroke on MRI  HPI:                                                                                                                                         Jeffrey Amberory D Fraser Dinixon is an 24 y.o. male with a past medical history significant for albinism and hypoglycemic reaction, who was in his usual state of health until 9 pm yesterday when suddenly developed difficulty speaking, face-mouth numbness (can not pinpoint if left or right side) and weakness of the left hand. He said that he woke up from a nap and later on developed such symptoms. He became very scared and went to his mother room who noticed that his face was drooped and couldn't speak fluently. Mother said that he last saw him normal around 2 pm when he left the house to meet some friends and he told her that he smoke some marihuana. His symptoms lasted approximately 45 minutes and completely resolved. Denies recent head or neck trauma, HA, vertigo, double vision, difficulty swallowing, unsteadiness, confusion, or vision impairment. MRI brain was personally reviewed and demonstrated a small acute infarction involving the cortical gray matter and underlying white matter of the right frontal lobe.  Date last known well: 09/28/14 Time last known well: 2 pm  tPA Given: no, out of the window, symptoms resolved   Past Medical History  Diagnosis Date  . Hypoglycemic reaction     Past Surgical History  Procedure Laterality Date  . Eye surgery      No family history on file. Social History:  reports that he has never smoked. He has never used smokeless tobacco. He reports that he drinks alcohol. He reports that he uses illicit drugs (Marijuana). Family history: no epilepsy, MS, brain tumors, stroke at young age, or brain aneurysms. Allergies:  Allergies  Allergen Reactions  . Amoxicillin Swelling and Rash    Medications:                                                                                                                            I have reviewed the patient's current medications.  ROS:  History obtained from the patient and mother  General ROS: negative for - chills, fatigue, fever, night sweats, weight gain or weight loss Psychological ROS: negative for - behavioral disorder, hallucinations, memory difficulties, mood swings or suicidal ideation Ophthalmic ROS: negative for - blurry vision, double vision, eye pain or loss of vision ENT ROS: negative for - epistaxis, nasal discharge, oral lesions, sore throat, tinnitus or vertigo Allergy and Immunology ROS: negative for - hives or itchy/watery eyes Hematological and Lymphatic ROS: negative for - bleeding problems, bruising or swollen lymph nodes Endocrine ROS: negative for - galactorrhea, hair pattern changes, polydipsia/polyuria or temperature intolerance Respiratory ROS: negative for - cough, hemoptysis, shortness of breath or wheezing Cardiovascular ROS: negative for - chest pain, dyspnea on exertion, edema or irregular heartbeat Gastrointestinal ROS: negative for - abdominal pain, diarrhea, hematemesis, nausea/vomiting or stool incontinence Genito-Urinary ROS: negative for - dysuria, hematuria, incontinence or urinary frequency/urgency Musculoskeletal ROS: negative for - joint swelling or muscular weakness Neurological ROS: as noted in HPI Dermatological ROS: negative for rash and skin lesion changes  Physical exam: pleasant male in no apparent distress. Blood pressure 97/55, pulse 67, temperature 97.5 F (36.4 C), temperature source Oral, resp. rate 16, SpO2 96 %. Head: normocephalic. Neck: supple, no bruits, no JVD. Cardiac: no murmurs. Lungs: clear. Abdomen: soft, no tender, no mass. Extremities: no edema. CV:  pulses palpable throughout  Skin: no rash  Neurologic Examination:                                                                                                      General: Mental Status: Alert, oriented, thought content appropriate.  Speech fluent without evidence of aphasia.  Able to follow 3 step commands without difficulty. Cranial Nerves: II: Discs flat bilaterally; Visual fields grossly normal, pupils equal, round, reactive to light and accommodation III,IV, VI: ptosis not present, multidirectional congenital nystagmus. V,VII: smile symmetric, facial light touch sensation normal bilaterally VIII: hearing normal bilaterally IX,X: uvula rises symmetrically XI: bilateral shoulder shrug XII: midline tongue extension without atrophy or fasciculations Motor: Right : Upper extremity   5/5    Left:     Upper extremity   5/5  Lower extremity   5/5     Lower extremity   5/5 Tone and bulk:normal tone throughout; no atrophy noted Sensory: Pinprick and light touch intact throughout, bilaterally Deep Tendon Reflexes:  Right: Upper Extremity   Left: Upper extremity   biceps (C-5 to C-6) 2/4   biceps (C-5 to C-6) 2/4 tricep (C7) 2/4    triceps (C7) 2/4 Brachioradialis (C6) 2/4  Brachioradialis (C6) 2/4  Lower Extremity Lower Extremity  quadriceps (L-2 to L-4) 2/4   quadriceps (L-2 to L-4) 2/4 Achilles (S1) 2/4   Achilles (S1) 2/4  Plantars: Right: downgoing   Left: downgoing Cerebellar: normal finger-to-nose,  normal heel-to-shin test Gait:  No ataxia.    No results found for this or any previous visit (from the past 48 hour(s)). Mr Brain Wo Contrast  09/29/2014   CLINICAL DATA:  Initial evaluation for left hand and lip numbness. Now asymptomatic.  EXAM: MRI HEAD WITHOUT CONTRAST  TECHNIQUE: Multiplanar, multiecho pulse sequences of the brain and surrounding structures were obtained without intravenous contrast.  COMPARISON:  None.  FINDINGS: The CSF containing spaces are within  normal limits for patient age. No focal parenchymal signal abnormality is identified. No mass lesion, midline shift, or extra-axial fluid collection. Ventricles are normal in size without evidence of hydrocephalus.  There is a linear abnormal focus of restricted diffusion involving the cortical gray matter an subjacent white matter within the right frontal lobe, consistent with an acute ischemic infarct (series 3, image 36). This is seen on coronal DWI sequence on series 4, image 15). Overall, this infarct measures approximately 2 cm in length. Associated signal dropout seen on ADC map. No associated hemorrhage or significant mass effect. No other infarct.  The cervicomedullary junction is normal. Pituitary gland is within normal limits. Pituitary stalk is midline. The globes and optic nerves demonstrate a normal appearance with normal signal intensity. The  The bone marrow signal intensity is normal. Calvarium is intact. Visualized upper cervical spine is within normal limits.  Scalp soft tissues are unremarkable.  Mild mucosal thickening present within the maxillary sinuses and ethmoidal air cells. No air-fluid levels to suggest active sinus infection. No mastoid effusion. Inner ear structures normal.  IMPRESSION: 1. 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 No associated hemorrhage or mass effect. 2. Otherwise normal brain MRI.   Electronically Signed   By: Rise MuBenjamin  McClintock M.D.   On: 09/29/2014 03:21    Assessment: 24 y.o. male with acute transient of transient dysarthria, left face numbness-weakness, left hand weakness that lasted approximately 45 minutes. No recent head/neck trauma. Consumes marihuana but denies cocaine. MRI brain revealed a small acute linear area of infarction involving the cortical gray matter and underlying white matter of the right frontal lobe. Admit to medicine. TIA work  up. Aspirin after passing swallowing evaluation.  Stroke Risk Factors -none   Plan: 1. HgbA1c, fasting lipid panel 2. MRI, MRA  of the brain without contrast 3. Echocardiogram 4. Carotid dopplers 5. Prophylactic therapy-aspirin 6. Risk factor modification 7. Telemetry monitoring 8. Frequent neuro checks 9. PT/OT SLP (no needed at this moment)  Wyatt Portelasvaldo Camilo, MD [facility] [phone]  09/29/2014, 4:49 AM

## 2014-09-29 NOTE — Progress Notes (Signed)
Pt arrived alert and oriented. Oriented to unit and room. Call bell at bedside. Will continue to monitor. Gara KronerHayles, Renita Brocks M, RN

## 2014-09-29 NOTE — ED Notes (Signed)
Arrived back from MRI.  No complaints voiced at this time.  All symptoms resolved.  Encouraged to call for assistance as needed.

## 2014-09-29 NOTE — ED Notes (Signed)
[facility].

## 2014-09-29 NOTE — ED Notes (Signed)
Neurologist at the bedside at this time.

## 2014-09-29 NOTE — Progress Notes (Signed)
VASCULAR LAB PRELIMINARY  PRELIMINARY  PRELIMINARY  PRELIMINARY  Bilateral lower extremity venous Dopplers completed.    Preliminary report:  There is no DVT or SVT noted in the bilateral lower extremities.   Massimo Hartland, RVT 09/29/2014, 12:35 PM

## 2014-09-29 NOTE — Consult Note (Signed)
Chief Complaint: Chief Complaint  Patient presents with  . Hand numbness   small Rt frontal infarct  Referring Physician(s): Dr Roda Shutters  History of Present Illness: Jeffrey Gilmore is a 24 y.o. male   May 12/2014 Pt suffered speech difficulty; facial numbness and L hand weakness which resolved after approx 45 minutes Pt had been seen wnl 200 pm that afternoon; went out and smoked marijuana and awoke 900pm with these sxs Work up reveals MRI with small Rt frontal infarct Now request for cerebral arteriogram per Dr Roda Shutters request No continued sxs   Past Medical History  Diagnosis Date  . Hypoglycemic reaction     Past Surgical History  Procedure Laterality Date  . Eye surgery      Allergies: Amoxicillin  Medications: Prior to Admission medications   Medication Sig Start Date End Date Taking? Authorizing Provider  ibuprofen (ADVIL,MOTRIN) 200 MG tablet Take 800 mg by mouth every 6 (six) hours as needed for pain.   Yes Historical Provider, MD  doxycycline (VIBRAMYCIN) 100 MG capsule Take 1 capsule (100 mg total) by mouth 2 (two) times daily. Patient not taking: Reported on 09/28/2014 07/23/14   Charlestine Night, PA-C  Guaifenesin 1200 MG TB12 Take 1 tablet (1,200 mg total) by mouth 2 (two) times daily. Patient not taking: Reported on 09/28/2014 07/23/14   Charlestine Night, PA-C  promethazine-dextromethorphan (PROMETHAZINE-DM) 6.25-15 MG/5ML syrup Take 5 mLs by mouth 4 (four) times daily as needed for cough. Patient not taking: Reported on 09/28/2014 07/23/14   Charlestine Night, PA-C     History reviewed. No pertinent family history.  History   Social History  . Marital Status: Single    Spouse Name: N/A  . Number of Children: N/A  . Years of Education: N/A   Social History Main Topics  . Smoking status: Never Smoker   . Smokeless tobacco: Never Used  . Alcohol Use: Yes     Comment: occasionally   . Drug Use: Yes    Special: Marijuana  . Sexual Activity: Yes    Birth  Control/ Protection: None   Other Topics Concern  . None   Social History Narrative    Review of Systems: A 12 point ROS discussed and pertinent positives are indicated in the HPI above.  All other systems are negative.  Review of Systems  Constitutional: Negative for fever, activity change, appetite change and fatigue.  HENT: Negative for drooling, hearing loss, tinnitus and trouble swallowing.   Eyes: Negative for photophobia and visual disturbance.  Respiratory: Negative for cough and shortness of breath.   Cardiovascular: Negative for chest pain.  Gastrointestinal: Negative for abdominal pain.  Genitourinary: Negative for difficulty urinating.  Neurological: Negative for dizziness, tremors, seizures, syncope, facial asymmetry, speech difficulty, weakness, light-headedness, numbness and headaches.  Psychiatric/Behavioral: Negative for behavioral problems and confusion.     Vital Signs: BP 112/73 mmHg  Pulse 77  Temp(Src) 98.1 F (36.7 C) (Oral)  Resp 16  SpO2 97%  Physical Exam  Constitutional: He is oriented to person, place, and time. He appears well-nourished.  Cardiovascular: Normal rate, regular rhythm and normal heart sounds.   No murmur heard. Pulmonary/Chest: Effort normal and breath sounds normal. He has no wheezes.  Abdominal: Soft. Bowel sounds are normal. There is no tenderness.  Musculoskeletal: Normal range of motion.  Neurological: He is alert and oriented to person, place, and time.  Skin: Skin is warm and dry.  Psychiatric: He has a normal mood and affect. His behavior is  normal. Judgment and thought content normal.  Nursing note and vitals reviewed.   Mallampati Score:  MD Evaluation Airway: WNL Heart: WNL Abdomen: WNL Chest/ Lungs: WNL ASA  Classification: 2 Mallampati/Airway Score: Two  Imaging: Ct Angio Head W/cm &/or Wo Cm  09/29/2014   CLINICAL DATA:  24 year old male presenting with left hand numbness and left facial droop yesterday  which lasted for 45 minutes. Subsequent encounter.  EXAM: CT ANGIOGRAPHY HEAD AND NECK  TECHNIQUE: Multidetector CT imaging of the head and neck was performed using the standard protocol during bolus administration of intravenous contrast. Multiplanar CT image reconstructions and MIPs were obtained to evaluate the vascular anatomy. Carotid stenosis measurements (when applicable) are obtained utilizing NASCET criteria, using the distal internal carotid diameter as the denominator.  CONTRAST:  50mL OMNIPAQUE IOHEXOL 350 MG/ML SOLN  COMPARISON:  09/29/2014 MR.  FINDINGS: CT HEAD  Brain: Small right frontal lobe infarct better delineated on recent brain MR. no intracranial hemorrhage. No intracranial enhancing lesion.  Calvarium and skull base: Negative.  Paranasal sinuses: Maxillary sinus slightly polypoid mucosal thickening greater on the right.  Orbits: Negative.  CTA NECK  Aortic arch: 3 vessel  Right carotid system: No significant stenosis or irregularity.  Left carotid system: No significant stenosis or irregularity.  Vertebral arteries:No significant stenosis or irregularity.  Skeleton: Negative.  Other neck: Prominent soft tissue anterior superior mediastinum most likely thymus. Prominence of lymph nodes throughout the neck, largest left level 2 region with short axis dimension of 1.6 cm. Prominent Waldeyer's ring. This prominent lymphoid tissue may be reactive in origin related to recent infection. More worrisome process such as lymphoma felt to be less likely in this setting of normal CBC and differential.  CTA HEAD  Anterior circulation: No medium or large size vessel significant stenosis. Slightly prominent vessels adjacent to the cavernous sinus region greater on right may be related to the late phase of injection rather than vascular malformation.  Posterior circulation:  No significant stenosis  Venous sinuses: As above  Anatomic variants: As above  Delayed phase: As above  IMPRESSION: Right frontal lobe  infarct better demonstrated on recent MR.  No medium or large size vessel significant stenosis. No obvious changes of vasculitis (subtle branch vessel vasculitis changes may not be detected by CT angiography).  Slightly prominent vessels adjacent to the cavernous sinus region greater on right may be related to the late phase of injection rather than vascular malformation. To help confirm this and exclude underlying vascular malformation, MR angiogram and MR venography can be obtained.  Prominent soft tissue anterior superior mediastinum most likely thymus. Prominent lymphoid tissue may be reactive in origin related to recent infection. More worrisome process such as lymphoma felt to be unlikely in this setting of normal CBC and differential.   Electronically Signed   By: Lacy DuverneySteven  Olson M.D.   On: 09/29/2014 09:28   Dg Chest 2 View  09/29/2014   CLINICAL DATA:  Stroke  EXAM: CHEST  2 VIEW  COMPARISON:  07/23/2014  FINDINGS: Cardiomediastinal silhouette is stable. No acute infiltrate or pleural effusion. No pulmonary edema. Mild hyperinflation.  IMPRESSION: No active cardiopulmonary disease.   Electronically Signed   By: Natasha MeadLiviu  Pop M.D.   On: 09/29/2014 08:02   Ct Angio Neck W/cm &/or Wo/cm  09/29/2014   CLINICAL DATA:  24 year old male presenting with left hand numbness and left facial droop yesterday which lasted for 45 minutes. Subsequent encounter.  EXAM: CT ANGIOGRAPHY HEAD AND NECK  TECHNIQUE: Multidetector CT  imaging of the head and neck was performed using the standard protocol during bolus administration of intravenous contrast. Multiplanar CT image reconstructions and MIPs were obtained to evaluate the vascular anatomy. Carotid stenosis measurements (when applicable) are obtained utilizing NASCET criteria, using the distal internal carotid diameter as the denominator.  CONTRAST:  50mL OMNIPAQUE IOHEXOL 350 MG/ML SOLN  COMPARISON:  09/29/2014 MR.  FINDINGS: CT HEAD  Brain: Small right frontal lobe infarct  better delineated on recent brain MR. no intracranial hemorrhage. No intracranial enhancing lesion.  Calvarium and skull base: Negative.  Paranasal sinuses: Maxillary sinus slightly polypoid mucosal thickening greater on the right.  Orbits: Negative.  CTA NECK  Aortic arch: 3 vessel  Right carotid system: No significant stenosis or irregularity.  Left carotid system: No significant stenosis or irregularity.  Vertebral arteries:No significant stenosis or irregularity.  Skeleton: Negative.  Other neck: Prominent soft tissue anterior superior mediastinum most likely thymus. Prominence of lymph nodes throughout the neck, largest left level 2 region with short axis dimension of 1.6 cm. Prominent Waldeyer's ring. This prominent lymphoid tissue may be reactive in origin related to recent infection. More worrisome process such as lymphoma felt to be less likely in this setting of normal CBC and differential.  CTA HEAD  Anterior circulation: No medium or large size vessel significant stenosis. Slightly prominent vessels adjacent to the cavernous sinus region greater on right may be related to the late phase of injection rather than vascular malformation.  Posterior circulation:  No significant stenosis  Venous sinuses: As above  Anatomic variants: As above  Delayed phase: As above  IMPRESSION: Right frontal lobe infarct better demonstrated on recent MR.  No medium or large size vessel significant stenosis. No obvious changes of vasculitis (subtle branch vessel vasculitis changes may not be detected by CT angiography).  Slightly prominent vessels adjacent to the cavernous sinus region greater on right may be related to the late phase of injection rather than vascular malformation. To help confirm this and exclude underlying vascular malformation, MR angiogram and MR venography can be obtained.  Prominent soft tissue anterior superior mediastinum most likely thymus. Prominent lymphoid tissue may be reactive in origin related  to recent infection. More worrisome process such as lymphoma felt to be unlikely in this setting of normal CBC and differential.   Electronically Signed   By: Lacy DuverneySteven  Olson M.D.   On: 09/29/2014 09:28   Mr Brain Wo Contrast  09/29/2014   CLINICAL DATA:  Initial evaluation for left hand and lip numbness. Now asymptomatic.  EXAM: MRI HEAD WITHOUT CONTRAST  TECHNIQUE: Multiplanar, multiecho pulse sequences of the brain and surrounding structures were obtained without intravenous contrast.  COMPARISON:  None.  FINDINGS: The CSF containing spaces are within normal limits for patient age. No focal parenchymal signal abnormality is identified. No mass lesion, midline shift, or extra-axial fluid collection. Ventricles are normal in size without evidence of hydrocephalus.  There is a linear abnormal focus of restricted diffusion involving the cortical gray matter an subjacent white matter within the right frontal lobe, consistent with an acute ischemic infarct (series 3, image 36). This is seen on coronal DWI sequence on series 4, image 15). Overall, this infarct measures approximately 2 cm in length. Associated signal dropout seen on ADC map. No associated hemorrhage or significant mass effect. No other infarct.  The cervicomedullary junction is normal. Pituitary gland is within normal limits. Pituitary stalk is midline. The globes and optic nerves demonstrate a normal appearance with normal signal intensity.  The  The bone marrow signal intensity is normal. Calvarium is intact. Visualized upper cervical spine is within normal limits.  Scalp soft tissues are unremarkable.  Mild mucosal thickening present within the maxillary sinuses and ethmoidal air cells. No air-fluid levels to suggest active sinus infection. No mastoid effusion. Inner ear structures normal.  IMPRESSION: 1. 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  No associated hemorrhage or mass effect. 2. Otherwise normal brain MRI.   Electronically Signed   By: Rise Mu M.D.   On: 09/29/2014 03:21    Labs:  CBC:  Recent Labs  09/29/14 0426  WBC 8.1  HGB 13.9  HCT 41.8  PLT 212    COAGS: No results for input(s): INR, APTT in the last 8760 hours.  BMP:  Recent Labs  09/29/14 0426  NA 138  K 3.8  CL 105  CO2 25  GLUCOSE 92  BUN 12  CALCIUM 9.2  CREATININE 0.99  GFRNONAA >60  GFRAA >60    LIVER FUNCTION TESTS: No results for input(s): BILITOT, AST, ALT, ALKPHOS, PROT, ALBUMIN in the last 8760 hours.  TUMOR MARKERS: No results for input(s): AFPTM, CEA, CA199, CHROMGRNA in the last 8760 hours.  Assessment and Plan:  L hand weakness; facial numbness; speech difficulty 5/8 x 45 mins Resolved - no continued sxs MRI does reveal small Rt frontal infarct Now scheduled for cerebral arteriogram Risks and Benefits discussed with the patient including, but not limited to bleeding, infection, vascular injury, contrast induced renal failure, stroke or even death. All of the patient's questions were answered, patient is agreeable to proceed. Consent signed and in chart.  Thank you for this interesting consult.  I greatly enjoyed meeting [facility] and look forward to participating in their care.  Signed: Luvern Mcisaac A 09/29/2014, 12:57 PM   I spent a total of 40 Minutes    in face to face in clinical consultation, greater than 50% of which was counseling/coordinating care for cerebral arteriogram

## 2014-09-29 NOTE — H&P (Signed)
[facility] History and Physical  Jeffrey Signsory D Tibbs ZOX:[id number] DOB: [birth year] DOA: 09/28/2014  Referring physician: Dr.Gentry. PCP: Kaleen MaskELKINS,WILSON OLIVER, MD  Specialists: None.  Chief Complaint: Slurred speech and left hand decreased grip strength.  HPI: Jeffrey Gilmore is a 24 y.o. 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 Patient states he woke up from sleep at around 8 PM last evening when he started developing the symptoms. The whole episode lasted for 45 minutes and completely resolved. In the ER MRI of the brain shows features consistent with stroke and patient has been admitted for further management. Patient denies any weakness on the right side and presently he is having complete strength on the left side. Denies any visual symptoms difficulty swallowing. Denies any chest pain or shortness of breath.   Review of Systems: As presented in the history of presenting illness, rest negative.  Past Medical History  Diagnosis Date  . Hypoglycemic reaction    Past Surgical History  Procedure Laterality Date  . Eye surgery     Social History:  reports that he has never smoked. He has never used smokeless tobacco. He reports that he drinks alcohol. He reports that he uses illicit drugs (Marijuana). Where does patient live home. Can patient participate in ADLs? Yes.  Allergies  Allergen Reactions  . Amoxicillin Swelling and Rash    Family History: History reviewed. No pertinent family history. grandparents have diabetes mellitus and hypertension.   Prior to Admission medications   Medication Sig Start Date End Date Taking? Authorizing Provider  ibuprofen (ADVIL,MOTRIN) 200 MG tablet Take 800 mg by mouth every 6 (six) hours as needed for pain.   Yes Historical Provider, MD  doxycycline (VIBRAMYCIN) 100 MG capsule Take 1 capsule (100 mg total) by mouth 2 (two) times  daily. Patient not taking: Reported on 09/28/2014 07/23/14   Charlestine Nighthristopher Lawyer, PA-C  Guaifenesin 1200 MG TB12 Take 1 tablet (1,200 mg total) by mouth 2 (two) times daily. Patient not taking: Reported on 09/28/2014 07/23/14   Charlestine Nighthristopher Lawyer, PA-C  promethazine-dextromethorphan (PROMETHAZINE-DM) 6.25-15 MG/5ML syrup Take 5 mLs by mouth 4 (four) times daily as needed for cough. Patient not taking: Reported on 09/28/2014 07/23/14   Charlestine Nighthristopher Lawyer, PA-C    Physical Exam: Filed Vitals:   09/29/14 0345 09/29/14 0400 09/29/14 0511 09/29/14 0525  BP: 94/51 97/55  109/68  Pulse: 55 67  69  Temp:   98.5 F (36.9 C) 98.1 F (36.7 C)  TempSrc:   Oral Oral  Resp:    18  SpO2: 97% 96%  96%     General:  Well-developed and nourished.  Eyes: Anicteric no pallor.  ENT: No discharge from the ears eyes nose,.  Neck: No mass felt.  Cardiovascular: S1-S2. Heard.  Respiratory: No rhonchi or crepitations.  Abdomen: Soft nontender bowel sounds present.  Skin: No rash.  Musculoskeletal: No edema.  Psychiatric: Appears normal.  Neurologic: Alert awake oriented to time place and person. Moves all extremities 5 x 5. No facial asymmetry. Tongue is midline.  Labs on Admission:  Basic Metabolic Panel:  Recent Labs Lab 09/29/14 0426  NA 138  K 3.8  CL 105  CO2 25  GLUCOSE 92  BUN 12  CREATININE 0.99  CALCIUM 9.2   Liver Function Tests: No results for input(s): AST, ALT, ALKPHOS, BILITOT, PROT, ALBUMIN in the last 168 hours. No results for input(s): LIPASE, AMYLASE in the last  168 hours. No results for input(s): AMMONIA in the last 168 hours. CBC:  Recent Labs Lab 09/29/14 0426  WBC 8.1  NEUTROABS 4.6  HGB 13.9  HCT 41.8  MCV 92.5  PLT 212   Cardiac Enzymes: No results for input(s): CKTOTAL, CKMB, CKMBINDEX, TROPONINI in the last 168 hours.  BNP (last 3 results) No results for input(s): BNP in the last 8760 hours.  ProBNP (last 3 results) No results for input(s): PROBNP  in the last 8760 hours.  CBG: No results for input(s): GLUCAP in the last 168 hours.  Radiological Exams on Admission: Mr Brain Wo Contrast  09/29/2014   CLINICAL DATA:  Initial evaluation for left hand and lip numbness. Now asymptomatic.  EXAM: MRI HEAD WITHOUT CONTRAST  TECHNIQUE: Multiplanar, multiecho pulse sequences of the brain and surrounding structures were obtained without intravenous contrast.  COMPARISON:  None.  FINDINGS: The CSF containing spaces are within normal limits for patient age. No focal parenchymal signal abnormality is identified. No mass lesion, midline shift, or extra-axial fluid collection. Ventricles are normal in size without evidence of hydrocephalus.  There is a linear abnormal focus of restricted diffusion involving the cortical gray matter an subjacent white matter within the right frontal lobe, consistent with an acute ischemic infarct (series 3, image 36). This is seen on coronal DWI sequence on series 4, image 15). Overall, this infarct measures approximately 2 cm in length. Associated signal dropout seen on ADC map. No associated hemorrhage or significant mass effect. No other infarct.  The cervicomedullary junction is normal. Pituitary gland is within normal limits. Pituitary stalk is midline. The globes and optic nerves demonstrate a normal appearance with normal signal intensity. The  The bone marrow signal intensity is normal. Calvarium is intact. Visualized upper cervical spine is within normal limits.  Scalp soft tissues are unremarkable.  Mild mucosal thickening present within the maxillary sinuses and ethmoidal air cells. No air-fluid levels to suggest active sinus infection. No mastoid effusion. Inner ear structures normal.  IMPRESSION: 1. 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 No associated hemorrhage or mass effect. 2. Otherwise normal brain MRI.    Electronically Signed   By: Rise MuBenjamin  McClintock M.D.   On: 09/29/2014 03:21     Assessment/Plan Principal Problem:   CVA (cerebral infarction) Active Problems:   Stroke   1. Stroke - I have discussed with on-call neurologist Dr. Cyril Mourningamillo. Patient just had CT angiogram of the head and neck which is pending. EKG is pending. Posterior monitor in telemetry. Check 2-D echo, lipid panel and hemoglobin A1c. Get physical therapy consult. Aspirin. And further recommendations per neurologist. Patient is placed on neurochecks and swallow evaluation.   DVT Prophylaxis Lovenox.  Code Status: Full code.  Family Communication: Patient's mother.  Disposition Plan: Admit to inpatient.    Kevia Zaucha N. [facility] Pager [phone].  If 7PM-7AM, please contact night-coverage [email or web address] Password [email or web address] 09/29/2014, 6:50 AM

## 2014-09-29 NOTE — Progress Notes (Signed)
[facility]                                                                              Patient Demographics  Jeffrey Gilmore, is a 24 y.o. male, DOB - 1991/04/09, VHQ:[id number]  Admit date - 09/28/2014   Admitting Physician Eduard ClosArshad N Kakrakandy, MD  Outpatient Primary MD for the patient is Kaleen MaskELKINS,WILSON OLIVER, MD  LOS - 0   Chief Complaint  Patient presents with  . Hand numbness       HPI on 09/29/2014 by Dr. Midge MiniumArshad Kakrakandy Jeffrey Gilmore is a 24 y.o. 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 Patient states he woke up from sleep at around 8 PM last evening when he started developing the symptoms. The whole episode lasted for 45 minutes and completely resolved. In the ER MRI of the brain shows features consistent with stroke and patient has been admitted for further management. Patient denies any weakness on the right side and presently he is having complete strength on the left side. Denies any visual symptoms difficulty swallowing. Denies any chest pain or shortness of breath.   Assessment & Plan  Patient admitted earlier this morning, please refer to full H&P. Agree with current assessment and plan.  Acute CVA -MRI brain: 2 cm acute linear ischemic infarct involving cortical gray matter and underlying white matter, right frontal lobe -CTA head/neck: Right frontal lobe infarct, no medium or large size vessel stenosis, no vasculitis  -Echocardiogram: Pending -Carotid doppler: Pending -LDL pending -hemoglobin A1c pending -PT/OT consulted pending -Neurology consulted and appreciated -Continue Aspirin  -Urine drug screen also pending  Polysubstance abuse  -Patient does admit to using marijuana, counseled against drug abuse   Code Status: Full  Family Communication: Family at bedside  Disposition Plan: Admitted, pending CVA workup  Time Spent in minutes   30  minutes  Procedures   Consults   Neurology  DVT Prophylaxis  Lovenox  Lab Results  Component Value Date   PLT 212 09/29/2014    Medications  Scheduled Meds: . aspirin EC  81 mg Oral Daily  . aspirin  300 mg Rectal Daily   Or  . aspirin  325 mg Oral Daily  . enoxaparin (LOVENOX) injection  30 mg Subcutaneous Q24H   Continuous Infusions: . sodium chloride     PRN Meds:.senna-docusate  Antibiotics    Anti-infectives    None        Subjective:   Jeffrey Gilmore seen and examined today.   patient denies any pain. States that his lip and left hand numbness has actually subsided and resolved. Denies any headache, vision changes, dizziness, chest pain, shortness of breath, abdominal pain. Denies any recent illness. There is admit to marijuana use.   Objective:   Filed Vitals:   09/29/14 0511 09/29/14 0525 09/29/14 0800 09/29/14 1000  BP:  109/68 98/39 107/56  Pulse:  69 59 64  Temp: 98.5 F (36.9 C) 98.1 F (36.7 C) 97.9 F (36.6 C)   TempSrc: Oral Oral Oral   Resp:  18 16 16   SpO2:  96% 97% 95%  Wt Readings from Last 3 Encounters:  09/30/12 83.008 kg (183 lb)    No intake or output data in the 24 hours ending 09/29/14 1046  Exam  General: Well developed, well nourished, NAD, appears stated age  HEENT: NCAT, PERRLA, EOMI, Anicteic Sclera, mucous membranes moist.   Neck: Supple, no JVD, no masses  Cardiovascular: S1 S2 auscultated, no rubs, murmurs or gallops. Regular rate and rhythm.  Respiratory: Clear to auscultation bilaterally with equal chest rise  Abdomen: Soft, nontender, nondistended, + bowel sounds  Extremities: warm dry without cyanosis clubbing or edema  Neuro: AAOx3, cranial nerves grossly intact. Strength 5/5 in patient's upper and lower extremities bilaterally  Skin: Without rashes exudates or nodules, albino  Psych: Normal affect and demeanor with intact judgement and insight   Data Review   Micro Results No results found for  this or any previous visit (from the past 240 hour(s)).  Radiology Reports Ct Angio Head W/cm &/or Wo Cm  09/29/2014   CLINICAL DATA:  25 year old male presenting with left hand numbness and left facial droop yesterday which lasted for 45 minutes. Subsequent encounter.  EXAM: CT ANGIOGRAPHY HEAD AND NECK  TECHNIQUE: Multidetector CT imaging of the head and neck was performed using the standard protocol during bolus administration of intravenous contrast. Multiplanar CT image reconstructions and MIPs were obtained to evaluate the vascular anatomy. Carotid stenosis measurements (when applicable) are obtained utilizing NASCET criteria, using the distal internal carotid diameter as the denominator.  CONTRAST:  50mL OMNIPAQUE IOHEXOL 350 MG/ML SOLN  COMPARISON:  09/29/2014 MR.  FINDINGS: CT HEAD  Brain: Small right frontal lobe infarct better delineated on recent brain MR. no intracranial hemorrhage. No intracranial enhancing lesion.  Calvarium and skull base: Negative.  Paranasal sinuses: Maxillary sinus slightly polypoid mucosal thickening greater on the right.  Orbits: Negative.  CTA NECK  Aortic arch: 3 vessel  Right carotid system: No significant stenosis or irregularity.  Left carotid system: No significant stenosis or irregularity.  Vertebral arteries:No significant stenosis or irregularity.  Skeleton: Negative.  Other neck: Prominent soft tissue anterior superior mediastinum most likely thymus. Prominence of lymph nodes throughout the neck, largest left level 2 region with short axis dimension of 1.6 cm. Prominent Waldeyer's ring. This prominent lymphoid tissue may be reactive in origin related to recent infection. More worrisome process such as lymphoma felt to be less likely in this setting of normal CBC and differential.  CTA HEAD  Anterior circulation: No medium or large size vessel significant stenosis. Slightly prominent vessels adjacent to the cavernous sinus region greater on right may be related to  the late phase of injection rather than vascular malformation.  Posterior circulation:  No significant stenosis  Venous sinuses: As above  Anatomic variants: As above  Delayed phase: As above  IMPRESSION: Right frontal lobe infarct better demonstrated on recent MR.  No medium or large size vessel significant stenosis. No obvious changes of vasculitis (subtle branch vessel vasculitis changes may not be detected by CT angiography).  Slightly prominent vessels adjacent to the cavernous sinus region greater on right may be related to the late phase of injection rather than vascular malformation. To help confirm this and exclude underlying vascular malformation, MR angiogram and MR venography can be obtained.  Prominent soft tissue anterior superior mediastinum most likely thymus. Prominent lymphoid tissue may be reactive in origin related to recent infection. More worrisome process such as lymphoma felt to be unlikely in this setting of normal CBC and differential.   Electronically  Signed   By: Lacy Duverney M.D.   On: 09/29/2014 09:28   Dg Chest 2 View  09/29/2014   CLINICAL DATA:  Stroke  EXAM: CHEST  2 VIEW  COMPARISON:  07/23/2014  FINDINGS: Cardiomediastinal silhouette is stable. No acute infiltrate or pleural effusion. No pulmonary edema. Mild hyperinflation.  IMPRESSION: No active cardiopulmonary disease.   Electronically Signed   By: Natasha Mead M.D.   On: 09/29/2014 08:02   Ct Angio Neck W/cm &/or Wo/cm  09/29/2014   CLINICAL DATA:  24 year old male presenting with left hand numbness and left facial droop yesterday which lasted for 45 minutes. Subsequent encounter.  EXAM: CT ANGIOGRAPHY HEAD AND NECK  TECHNIQUE: Multidetector CT imaging of the head and neck was performed using the standard protocol during bolus administration of intravenous contrast. Multiplanar CT image reconstructions and MIPs were obtained to evaluate the vascular anatomy. Carotid stenosis measurements (when applicable) are obtained  utilizing NASCET criteria, using the distal internal carotid diameter as the denominator.  CONTRAST:  50mL OMNIPAQUE IOHEXOL 350 MG/ML SOLN  COMPARISON:  09/29/2014 MR.  FINDINGS: CT HEAD  Brain: Small right frontal lobe infarct better delineated on recent brain MR. no intracranial hemorrhage. No intracranial enhancing lesion.  Calvarium and skull base: Negative.  Paranasal sinuses: Maxillary sinus slightly polypoid mucosal thickening greater on the right.  Orbits: Negative.  CTA NECK  Aortic arch: 3 vessel  Right carotid system: No significant stenosis or irregularity.  Left carotid system: No significant stenosis or irregularity.  Vertebral arteries:No significant stenosis or irregularity.  Skeleton: Negative.  Other neck: Prominent soft tissue anterior superior mediastinum most likely thymus. Prominence of lymph nodes throughout the neck, largest left level 2 region with short axis dimension of 1.6 cm. Prominent Waldeyer's ring. This prominent lymphoid tissue may be reactive in origin related to recent infection. More worrisome process such as lymphoma felt to be less likely in this setting of normal CBC and differential.  CTA HEAD  Anterior circulation: No medium or large size vessel significant stenosis. Slightly prominent vessels adjacent to the cavernous sinus region greater on right may be related to the late phase of injection rather than vascular malformation.  Posterior circulation:  No significant stenosis  Venous sinuses: As above  Anatomic variants: As above  Delayed phase: As above  IMPRESSION: Right frontal lobe infarct better demonstrated on recent MR.  No medium or large size vessel significant stenosis. No obvious changes of vasculitis (subtle branch vessel vasculitis changes may not be detected by CT angiography).  Slightly prominent vessels adjacent to the cavernous sinus region greater on right may be related to the late phase of injection rather than vascular malformation. To help confirm this  and exclude underlying vascular malformation, MR angiogram and MR venography can be obtained.  Prominent soft tissue anterior superior mediastinum most likely thymus. Prominent lymphoid tissue may be reactive in origin related to recent infection. More worrisome process such as lymphoma felt to be unlikely in this setting of normal CBC and differential.   Electronically Signed   By: Lacy Duverney M.D.   On: 09/29/2014 09:28   Mr Brain Wo Contrast  09/29/2014   CLINICAL DATA:  Initial evaluation for left hand and lip numbness. Now asymptomatic.  EXAM: MRI HEAD WITHOUT CONTRAST  TECHNIQUE: Multiplanar, multiecho pulse sequences of the brain and surrounding structures were obtained without intravenous contrast.  COMPARISON:  None.  FINDINGS: The CSF containing spaces are within normal limits for patient age. No focal parenchymal signal abnormality  is identified. No mass lesion, midline shift, or extra-axial fluid collection. Ventricles are normal in size without evidence of hydrocephalus.  There is a linear abnormal focus of restricted diffusion involving the cortical gray matter an subjacent white matter within the right frontal lobe, consistent with an acute ischemic infarct (series 3, image 36). This is seen on coronal DWI sequence on series 4, image 15). Overall, this infarct measures approximately 2 cm in length. Associated signal dropout seen on ADC map. No associated hemorrhage or significant mass effect. No other infarct.  The cervicomedullary junction is normal. Pituitary gland is within normal limits. Pituitary stalk is midline. The globes and optic nerves demonstrate a normal appearance with normal signal intensity. The  The bone marrow signal intensity is normal. Calvarium is intact. Visualized upper cervical spine is within normal limits.  Scalp soft tissues are unremarkable.  Mild mucosal thickening present within the maxillary sinuses and ethmoidal air cells. No air-fluid levels to suggest active sinus  infection. No mastoid effusion. Inner ear structures normal.  IMPRESSION: 1. 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 No associated hemorrhage or mass effect. 2. Otherwise normal brain MRI.   Electronically Signed   By: Rise MuBenjamin  McClintock M.D.   On: 09/29/2014 03:21    CBC  Recent Labs Lab 09/29/14 0426  WBC 8.1  HGB 13.9  HCT 41.8  PLT 212  MCV 92.5  MCH 30.8  MCHC 33.3  RDW 12.9  LYMPHSABS 2.2  MONOABS 0.8  EOSABS 0.4  BASOSABS 0.1    Chemistries   Recent Labs Lab 09/29/14 0426  NA 138  K 3.8  CL 105  CO2 25  GLUCOSE 92  BUN 12  CREATININE 0.99  CALCIUM 9.2   ------------------------------------------------------------------------------------------------------------------ CrCl cannot be calculated (Unknown ideal weight.). ------------------------------------------------------------------------------------------------------------------ No results for input(s): HGBA1C in the last 72 hours. ------------------------------------------------------------------------------------------------------------------ No results for input(s): CHOL, HDL, LDLCALC, TRIG, CHOLHDL, LDLDIRECT in the last 72 hours. ------------------------------------------------------------------------------------------------------------------ No results for input(s): TSH, T4TOTAL, T3FREE, THYROIDAB in the last 72 hours.  Invalid input(s): FREET3 ------------------------------------------------------------------------------------------------------------------ No results for input(s): VITAMINB12, FOLATE, FERRITIN, TIBC, IRON, RETICCTPCT in the last 72 hours.  Coagulation profile No results for input(s): INR, PROTIME in the last 168 hours.  No results for input(s): DDIMER in the last 72 hours.  Cardiac Enzymes No results for input(s): CKMB, TROPONINI, MYOGLOBIN in the last 168 hours.  Invalid  input(s): CK ------------------------------------------------------------------------------------------------------------------ Invalid input(s): POCBNP    Ariabella Brien D.O. on 09/29/2014 at 10:46 AM  Between 7am to 7pm - Pager - [phone]  After 7pm go to [email or web address] - password [email or web address]  And look for the night coverage person covering for me after hours  [facility] Office  [phone]

## 2014-09-30 ENCOUNTER — Inpatient Hospital Stay (HOSPITAL_COMMUNITY): Payer: Medicaid Other

## 2014-09-30 ENCOUNTER — Encounter (HOSPITAL_COMMUNITY)
Admission: EM | Disposition: A | Discharge status: Home / Self Care | Payer: Self-pay | Source: Home / Self Care | Attending: Internal Medicine

## 2014-09-30 ENCOUNTER — Inpatient Hospital Stay (HOSPITAL_COMMUNITY)
Admit: 2014-09-30 | Discharge: 2014-09-30 | Disposition: A | Discharge status: Home / Self Care | Payer: Medicaid Other | Attending: Cardiovascular Disease | Admitting: Cardiovascular Disease

## 2014-09-30 ENCOUNTER — Encounter (HOSPITAL_COMMUNITY): Payer: Self-pay | Admitting: *Deleted

## 2014-09-30 DIAGNOSIS — E785 Hyperlipidemia, unspecified: Secondary | ICD-10-CM | POA: Insufficient documentation

## 2014-09-30 DIAGNOSIS — I639 Cerebral infarction, unspecified: Secondary | ICD-10-CM

## 2014-09-30 DIAGNOSIS — I63411 Cerebral infarction due to embolism of right middle cerebral artery: Secondary | ICD-10-CM

## 2014-09-30 DIAGNOSIS — F1210 Cannabis abuse, uncomplicated: Secondary | ICD-10-CM

## 2014-09-30 HISTORY — PX: TEE WITHOUT CARDIOVERSION: SHX5443

## 2014-09-30 LAB — COMPLEMENT, TOTAL: Compl, Total (CH50): 56 U/mL (ref 42–60)

## 2014-09-30 LAB — LUPUS ANTICOAGULANT PANEL
DRVVT: 35.1 s (ref 0.0–55.1)
PTT Lupus Anticoagulant: 39.8 s (ref 0.0–50.0)

## 2014-09-30 LAB — PROTIME-INR
INR: 1.05 (ref 0.00–1.49)
Prothrombin Time: 13.8 seconds (ref 11.6–15.2)

## 2014-09-30 LAB — PROTEIN C ACTIVITY: Protein C Activity: 149 % (ref 74–151)

## 2014-09-30 LAB — APTT: aPTT: 30 seconds (ref 24–37)

## 2014-09-30 LAB — C4 COMPLEMENT: Complement C4, Body Fluid: 28 mg/dL (ref 14–44)

## 2014-09-30 LAB — RPR: RPR: NONREACTIVE

## 2014-09-30 LAB — LIPID PANEL
CHOL/HDL RATIO: 3.7 ratio
CHOLESTEROL: 140 mg/dL (ref 0–200)
HDL: 38 mg/dL — AB (ref >40)
LDL Cholesterol: 87 mg/dL (ref 0–99)
Triglycerides: 74 mg/dL (ref <150)
VLDL: 15 mg/dL (ref 0–40)

## 2014-09-30 LAB — HEMOGLOBIN A1C
HEMOGLOBIN A1C: 5.6 % (ref 4.8–5.6)
Mean Plasma Glucose: 114 mg/dL

## 2014-09-30 LAB — RHEUMATOID FACTOR

## 2014-09-30 LAB — PROTEIN S, TOTAL: Protein S Ag, Total: 127 % (ref 58–150)

## 2014-09-30 LAB — HIV ANTIBODY (ROUTINE TESTING W REFLEX): HIV SCREEN 4TH GENERATION: NONREACTIVE

## 2014-09-30 LAB — HOMOCYSTEINE: Homocysteine: 8.3 umol/L (ref 0.0–15.0)

## 2014-09-30 LAB — PROTEIN S ACTIVITY: Protein S Activity: 131 % (ref 60–145)

## 2014-09-30 LAB — C3 COMPLEMENT: C3 Complement: 110 mg/dL (ref 82–167)

## 2014-09-30 SURGERY — ECHOCARDIOGRAM, TRANSESOPHAGEAL
Anesthesia: Moderate Sedation

## 2014-09-30 MED ORDER — ASPIRIN EC 81 MG PO TBEC: 81.0000 mg | DELAYED_RELEASE_TABLET | Freq: Every day | ORAL | Status: DC

## 2014-09-30 MED ORDER — FENTANYL CITRATE (PF) 100 MCG/2ML IJ SOLN
INTRAMUSCULAR | Status: DC | PRN
  Administered 2014-09-30: 25 ug via INTRAVENOUS

## 2014-09-30 MED ORDER — MIDAZOLAM HCL 2 MG/2ML IJ SOLN
INTRAMUSCULAR | Status: AC
  Filled 2014-09-30: qty 2

## 2014-09-30 MED ORDER — SIMVASTATIN 5 MG PO TABS
10.0000 mg | ORAL_TABLET | Freq: Every day | ORAL | Status: DC
Start: 2014-09-30 — End: 2014-09-30

## 2014-09-30 MED ORDER — HEPARIN SOD (PORK) LOCK FLUSH 100 UNIT/ML IV SOLN
INTRAVENOUS | Status: AC
  Filled 2014-09-30: qty 20

## 2014-09-30 MED ORDER — SENNOSIDES-DOCUSATE SODIUM 8.6-50 MG PO TABS
1.0000 | ORAL_TABLET | Freq: Every evening | ORAL | Status: DC | PRN
Start: 2014-09-30 — End: 2014-12-17

## 2014-09-30 MED ORDER — MIDAZOLAM HCL 5 MG/ML IJ SOLN
INTRAMUSCULAR | Status: AC
  Filled 2014-09-30: qty 2

## 2014-09-30 MED ORDER — DIPHENHYDRAMINE HCL 50 MG/ML IJ SOLN
INTRAMUSCULAR | Status: DC | PRN
  Administered 2014-09-30: 50 mg via INTRAVENOUS

## 2014-09-30 MED ORDER — PHENOL 1.4 % MT LIQD
1.0000 | OROMUCOSAL | Status: DC | PRN
  Administered 2014-09-30: 1 via OROMUCOSAL
  Filled 2014-09-30: qty 177

## 2014-09-30 MED ORDER — BUTAMBEN-TETRACAINE-BENZOCAINE 2-2-14 % EX AERO
INHALATION_SPRAY | CUTANEOUS | Status: DC | PRN
  Administered 2014-09-30: 2 via TOPICAL

## 2014-09-30 MED ORDER — ASPIRIN 81 MG PO TBEC: 81.0000 mg | DELAYED_RELEASE_TABLET | Freq: Every day | ORAL | Status: DC

## 2014-09-30 MED ORDER — LIDOCAINE HCL 1 % IJ SOLN
INTRAMUSCULAR | Status: AC
  Filled 2014-09-30: qty 20

## 2014-09-30 MED ORDER — IOHEXOL 300 MG/ML  SOLN
150.0000 mL | Freq: Once | INTRAMUSCULAR | Status: AC | PRN
  Administered 2014-09-30: 70 mL via INTRA_ARTERIAL

## 2014-09-30 MED ORDER — SIMVASTATIN 10 MG PO TABS: 10.0000 mg | ORAL_TABLET | Freq: Every day | ORAL | Status: DC

## 2014-09-30 MED ORDER — SODIUM CHLORIDE 0.9 % IV SOLN: INTRAVENOUS | Status: DC

## 2014-09-30 MED ORDER — SODIUM CHLORIDE 0.9 % IV SOLN
INTRAVENOUS | Status: AC | PRN
  Administered 2014-09-30: 10 mL/h via INTRAVENOUS

## 2014-09-30 MED ORDER — MIDAZOLAM HCL 10 MG/2ML IJ SOLN
INTRAMUSCULAR | Status: DC | PRN
  Administered 2014-09-30: 2 mg via INTRAVENOUS
  Administered 2014-09-30: 1 mg via INTRAVENOUS
  Administered 2014-09-30: 2 mg via INTRAVENOUS

## 2014-09-30 MED ORDER — FENTANYL CITRATE (PF) 100 MCG/2ML IJ SOLN
INTRAMUSCULAR | Status: AC
  Filled 2014-09-30: qty 2

## 2014-09-30 MED ORDER — DIPHENHYDRAMINE HCL 50 MG/ML IJ SOLN
INTRAMUSCULAR | Status: AC
  Filled 2014-09-30: qty 1

## 2014-09-30 MED ORDER — HEPARIN SODIUM (PORCINE) 1000 UNIT/ML IJ SOLN
INTRAMUSCULAR | Status: AC | PRN
  Administered 2014-09-30: 1000 [IU] via INTRAVENOUS

## 2014-09-30 NOTE — Evaluation (Signed)
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 Imaging revealed 2 cm acute linear ischemic infarct involving cortical gray matter and underlying white matter, right frontal lobe.  Clinical Impression  Patient mobilizing well, reports resolution of symptoms. No difficulty with ambulation or stair negotiation at this time. Patient educated on BEFAST stroke symptoms and management from mobility and functional standpoint. No further acute PT needs, will sign off.    Follow Up Recommendations No PT follow up    Equipment Recommendations  None recommended by PT    Recommendations for Other Services       Precautions / Restrictions        Mobility  Bed Mobility Overal bed mobility: Independent                Transfers Overall transfer level: Independent                  Ambulation/Gait Ambulation/Gait assistance: Independent;Modified independent (Device/Increase time) Ambulation Distance (Feet): 220 Feet Assistive device: None (pushing IV pole and without deivce) Gait Pattern/deviations: WFL(Within Functional Limits)   Gait velocity interpretation: at or above normal speed for age/gender General Gait Details: steady WFL  Stairs Stairs: Yes Stairs assistance: Independent Stair Management: No rails;Alternating pattern Number of Stairs: 12 General stair comments: no physical assist of deficits noted  Wheelchair Mobility    Modified Rankin (Stroke Patients Only) Modified Rankin (Stroke Patients Only) Pre-Morbid Rankin Score: No symptoms Modified Rankin: No significant disability     Balance Overall balance assessment: No apparent balance deficits (not formally assessed)                                            Pertinent Vitals/Pain Pain Assessment: No/denies pain    Home Living Family/patient expects to be discharged to:: Private residence Living Arrangements: Other relatives (lives with brother) Available Help at Discharge: Family;Friend(s) Type of Home: Apartment Home Access: Stairs to enter Entrance Stairs-Rails: Can reach both Entrance Stairs-Number of Steps: 16 Home Layout: One level Home Equipment: Crutches Additional Comments: tub, standard height toilets    Prior Function Level of Independence: Independent               Hand Dominance   Dominant Hand: Left    Extremity/Trunk Assessment   Upper Extremity Assessment: Defer to OT evaluation           Lower Extremity Assessment: Overall WFL for tasks assessed         Communication   Communication: No difficulties  Cognition Arousal/Alertness: Awake/alert Behavior During Therapy: WFL for tasks assessed/performed Overall Cognitive Status: Within Functional Limits for tasks assessed                      General Comments      Exercises        Assessment/Plan    PT Assessment Patent does not need any further PT services  PT Diagnosis Generalized weakness (resolved)   PT Problem List    PT Treatment Interventions     PT Goals (Current goals can be found in the Care Plan section) Acute Rehab PT Goals Patient Stated Goal: to go  home PT Goal Formulation: All assessment and education complete, DC therapy    Frequency     Barriers to discharge        Co-evaluation               End of Session Equipment Utilized During Treatment: Gait belt Activity Tolerance: Patient tolerated treatment well Patient left:  (sitting EOB with OT) Nurse Communication: Mobility status         Time: 0713-0730 PT Time Calculation (min) (ACUTE ONLY): 17 min   Charges:   PT Evaluation $Initial PT Evaluation Tier I: 1 Procedure     PT G CodesFabio Gilmore:        Jeffrey Zbikowski  Gilmore 09/30/2014, 7:34 AM Charlotte Crumbevon Cathern Tahir, PT DPT  [phone]

## 2014-09-30 NOTE — Discharge Summary (Signed)
Physician Discharge Summary  Jeffrey Gilmore ZOX:[id number] DOB: [birth year] DOA: 09/28/2014  PCP: Kaleen MaskELKINS,WILSON OLIVER, MD  Admit date: 09/28/2014 Discharge date: 09/30/2014  Time spent: 40 minutes  Recommendations for Outpatient Follow-up:  Patient will be discharged to home.  Patient will need to follow up with primary care provider within one week of discharge.  Patient will also need to follow up with Dr. Roda ShuttersXu, neurology, within 2 months.  Patient should continue medications as prescribed.  Patient should follow a regular diet diet.  Avoid smoking hookah, marijuana, cigarettes.   Discharge Diagnoses:  Acute CVA Polysubstance abuse  Discharge Condition: Stable  Diet recommendation: Regular  Filed Weights   09/30/14 0828  Weight: 83.008 kg (183 lb)    History of present illness:  on 09/29/2014 by Dr. Midge MiniumArshad Kakrakandy Jeffrey Signsory D Huhn is a 24 y.o. 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 Patient states he woke up from sleep at around 8 PM last evening when he started developing the symptoms. The whole episode lasted for 45 minutes and completely resolved. In the ER MRI of the brain shows features consistent with stroke and patient has been admitted for further management. Patient denies any weakness on the right side and presently he is having complete strength on the left side. Denies any visual symptoms difficulty swallowing. Denies any chest pain or shortness of breath.   Hospital Course:  Acute CVA -MRI brain: 2 cm acute linear ischemic infarct involving cortical gray matter and underlying white matter, right frontal lobe -CTA head/neck: Right frontal lobe infarct, no medium or large size vessel stenosis, no vasculitis  -Echocardiogram/TEE: EF 60%, No LAA thrombus, small PFO, positive bubble study with valsalva, no embolus -LDL 87 - would try lifestyle modifications before starting a 24  year old on statin, follow up in 3-6 months (prescription given) -hemoglobin A1c 5.6 -PT/OT consulted - none recommended  -Neurology consulted and appreciated- pending hypercoagulable workup- follow up with Dr. Roda ShuttersXu -thus far: normal Complement, negative RPR and HIV -Cerebral angiogram: no occlusions, stenosis, dissection or AV shunting, venous outflow normal, no aneurysms or AVMs -Continue Aspirin  -Urine drug screen: negative  Polysubstance abuse  -Patient does admit to using marijuana, counseled against drug abuse   Procedures: TEE LE doppler Cerebral angiogram  Consultations: Neurology Interventional radiology Cardiology  Discharge Exam: Filed Vitals:   09/30/14 1033  BP: 108/56  Pulse: 60  Temp: 97.5 F (36.4 C)  Resp: 18   Exam  General: Well developed, well nourished, no distress  HEENT: NCAT, mucous membranes moist.   Cardiovascular: S1 S2 auscultated, RRR, no murmurs  Respiratory: Clear to auscultation  Abdomen: Soft, nontender, nondistended, + bowel sounds  Extremities: warm dry without cyanosis clubbing or edema  Neuro: AAOx3, nonfocal  Skin: Without rashes exudates or nodules, albino  Psych: Normal affect and demeanor  Discharge Instructions     Medication List    ASK your doctor about these medications        doxycycline 100 MG capsule  Commonly known as:  VIBRAMYCIN  Take 1 capsule (100 mg total) by mouth 2 (two) times daily.     Guaifenesin 1200 MG Tb12  Take 1 tablet (1,200 mg total) by mouth 2 (two) times daily.     ibuprofen 200 MG tablet  Commonly known as:  ADVIL,MOTRIN  Take 800 mg by mouth every 6 (six) hours as needed for pain.  promethazine-dextromethorphan 6.25-15 MG/5ML syrup  Commonly known as:  PROMETHAZINE-DM  Take 5 mLs by mouth 4 (four) times daily as needed for cough.       Allergies  Allergen Reactions  . Amoxicillin Swelling and Rash      The results of significant diagnostics from this  hospitalization (including imaging, microbiology, ancillary and laboratory) are listed below for reference.    Significant Diagnostic Studies: Ct Angio Head W/cm &/or Wo Cm  09/29/2014   CLINICAL DATA:  24 year old male presenting with left hand numbness and left facial droop yesterday which lasted for 45 minutes. Subsequent encounter.  EXAM: CT ANGIOGRAPHY HEAD AND NECK  TECHNIQUE: Multidetector CT imaging of the head and neck was performed using the standard protocol during bolus administration of intravenous contrast. Multiplanar CT image reconstructions and MIPs were obtained to evaluate the vascular anatomy. Carotid stenosis measurements (when applicable) are obtained utilizing NASCET criteria, using the distal internal carotid diameter as the denominator.  CONTRAST:  50mL OMNIPAQUE IOHEXOL 350 MG/ML SOLN  COMPARISON:  09/29/2014 MR.  FINDINGS: CT HEAD  Brain: Small right frontal lobe infarct better delineated on recent brain MR. no intracranial hemorrhage. No intracranial enhancing lesion.  Calvarium and skull base: Negative.  Paranasal sinuses: Maxillary sinus slightly polypoid mucosal thickening greater on the right.  Orbits: Negative.  CTA NECK  Aortic arch: 3 vessel  Right carotid system: No significant stenosis or irregularity.  Left carotid system: No significant stenosis or irregularity.  Vertebral arteries:No significant stenosis or irregularity.  Skeleton: Negative.  Other neck: Prominent soft tissue anterior superior mediastinum most likely thymus. Prominence of lymph nodes throughout the neck, largest left level 2 region with short axis dimension of 1.6 cm. Prominent Waldeyer's ring. This prominent lymphoid tissue may be reactive in origin related to recent infection. More worrisome process such as lymphoma felt to be less likely in this setting of normal CBC and differential.  CTA HEAD  Anterior circulation: No medium or large size vessel significant stenosis. Slightly prominent vessels adjacent  to the cavernous sinus region greater on right may be related to the late phase of injection rather than vascular malformation.  Posterior circulation:  No significant stenosis  Venous sinuses: As above  Anatomic variants: As above  Delayed phase: As above  IMPRESSION: Right frontal lobe infarct better demonstrated on recent MR.  No medium or large size vessel significant stenosis. No obvious changes of vasculitis (subtle branch vessel vasculitis changes may not be detected by CT angiography).  Slightly prominent vessels adjacent to the cavernous sinus region greater on right may be related to the late phase of injection rather than vascular malformation. To help confirm this and exclude underlying vascular malformation, MR angiogram and MR venography can be obtained.  Prominent soft tissue anterior superior mediastinum most likely thymus. Prominent lymphoid tissue may be reactive in origin related to recent infection. More worrisome process such as lymphoma felt to be unlikely in this setting of normal CBC and differential.   Electronically Signed   By: Lacy Duverney M.D.   On: 09/29/2014 09:28   Dg Chest 2 View  09/29/2014   CLINICAL DATA:  Stroke  EXAM: CHEST  2 VIEW  COMPARISON:  07/23/2014  FINDINGS: Cardiomediastinal silhouette is stable. No acute infiltrate or pleural effusion. No pulmonary edema. Mild hyperinflation.  IMPRESSION: No active cardiopulmonary disease.   Electronically Signed   By: Natasha Mead M.D.   On: 09/29/2014 08:02   Ct Angio Neck W/cm &/or Wo/cm  09/29/2014  CLINICAL DATA:  24 year old male presenting with left hand numbness and left facial droop yesterday which lasted for 45 minutes. Subsequent encounter.  EXAM: CT ANGIOGRAPHY HEAD AND NECK  TECHNIQUE: Multidetector CT imaging of the head and neck was performed using the standard protocol during bolus administration of intravenous contrast. Multiplanar CT image reconstructions and MIPs were obtained to evaluate the vascular anatomy.  Carotid stenosis measurements (when applicable) are obtained utilizing NASCET criteria, using the distal internal carotid diameter as the denominator.  CONTRAST:  50mL OMNIPAQUE IOHEXOL 350 MG/ML SOLN  COMPARISON:  09/29/2014 MR.  FINDINGS: CT HEAD  Brain: Small right frontal lobe infarct better delineated on recent brain MR. no intracranial hemorrhage. No intracranial enhancing lesion.  Calvarium and skull base: Negative.  Paranasal sinuses: Maxillary sinus slightly polypoid mucosal thickening greater on the right.  Orbits: Negative.  CTA NECK  Aortic arch: 3 vessel  Right carotid system: No significant stenosis or irregularity.  Left carotid system: No significant stenosis or irregularity.  Vertebral arteries:No significant stenosis or irregularity.  Skeleton: Negative.  Other neck: Prominent soft tissue anterior superior mediastinum most likely thymus. Prominence of lymph nodes throughout the neck, largest left level 2 region with short axis dimension of 1.6 cm. Prominent Waldeyer's ring. This prominent lymphoid tissue may be reactive in origin related to recent infection. More worrisome process such as lymphoma felt to be less likely in this setting of normal CBC and differential.  CTA HEAD  Anterior circulation: No medium or large size vessel significant stenosis. Slightly prominent vessels adjacent to the cavernous sinus region greater on right may be related to the late phase of injection rather than vascular malformation.  Posterior circulation:  No significant stenosis  Venous sinuses: As above  Anatomic variants: As above  Delayed phase: As above  IMPRESSION: Right frontal lobe infarct better demonstrated on recent MR.  No medium or large size vessel significant stenosis. No obvious changes of vasculitis (subtle branch vessel vasculitis changes may not be detected by CT angiography).  Slightly prominent vessels adjacent to the cavernous sinus region greater on right may be related to the late phase of  injection rather than vascular malformation. To help confirm this and exclude underlying vascular malformation, MR angiogram and MR venography can be obtained.  Prominent soft tissue anterior superior mediastinum most likely thymus. Prominent lymphoid tissue may be reactive in origin related to recent infection. More worrisome process such as lymphoma felt to be unlikely in this setting of normal CBC and differential.   Electronically Signed   By: Lacy DuverneySteven  Olson M.D.   On: 09/29/2014 09:28   Mr Brain Wo Contrast  09/29/2014   CLINICAL DATA:  Initial evaluation for left hand and lip numbness. Now asymptomatic.  EXAM: MRI HEAD WITHOUT CONTRAST  TECHNIQUE: Multiplanar, multiecho pulse sequences of the brain and surrounding structures were obtained without intravenous contrast.  COMPARISON:  None.  FINDINGS: The CSF containing spaces are within normal limits for patient age. No focal parenchymal signal abnormality is identified. No mass lesion, midline shift, or extra-axial fluid collection. Ventricles are normal in size without evidence of hydrocephalus.  There is a linear abnormal focus of restricted diffusion involving the cortical gray matter an subjacent white matter within the right frontal lobe, consistent with an acute ischemic infarct (series 3, image 36). This is seen on coronal DWI sequence on series 4, image 15). Overall, this infarct measures approximately 2 cm in length. Associated signal dropout seen on ADC map. No associated hemorrhage or significant mass  effect. No other infarct.  The cervicomedullary junction is normal. Pituitary gland is within normal limits. Pituitary stalk is midline. The globes and optic nerves demonstrate a normal appearance with normal signal intensity. The  The bone marrow signal intensity is normal. Calvarium is intact. Visualized upper cervical spine is within normal limits.  Scalp soft tissues are unremarkable.  Mild mucosal thickening present within the maxillary sinuses  and ethmoidal air cells. No air-fluid levels to suggest active sinus infection. No mastoid effusion. Inner ear structures normal.  IMPRESSION: 1. 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 No associated hemorrhage or mass effect. 2. Otherwise normal brain MRI.   Electronically Signed   By: Rise Mu M.D.   On: 09/29/2014 03:21    Microbiology: No results found for this or any previous visit (from the past 240 hour(s)).   Labs: Basic Metabolic Panel:  Recent Labs Lab 09/29/14 0426  NA 138  K 3.8  CL 105  CO2 25  GLUCOSE 92  BUN 12  CREATININE 0.99  CALCIUM 9.2   Liver Function Tests: No results for input(s): AST, ALT, ALKPHOS, BILITOT, PROT, ALBUMIN in the last 168 hours. No results for input(s): LIPASE, AMYLASE in the last 168 hours. No results for input(s): AMMONIA in the last 168 hours. CBC:  Recent Labs Lab 09/29/14 0426  WBC 8.1  NEUTROABS 4.6  HGB 13.9  HCT 41.8  MCV 92.5  PLT 212   Cardiac Enzymes: No results for input(s): CKTOTAL, CKMB, CKMBINDEX, TROPONINI in the last 168 hours. BNP: BNP (last 3 results) No results for input(s): BNP in the last 8760 hours.  ProBNP (last 3 results) No results for input(s): PROBNP in the last 8760 hours.  CBG: No results for input(s): GLUCAP in the last 168 hours.     SignedEdsel Petrin  [facility] 09/30/2014, 10:57 AM

## 2014-09-30 NOTE — Progress Notes (Signed)
SLP Cancellation Note  Patient Details Name: Jeffrey Gilmore MRN: [id number] DOB: 1990-07-30   Cancelled treatment:        Unable to complete SLE at this time. Pt off unit for testing. Will continue efforts.  [name] B. [place], [facility] LPMSP, CCC-SLP [phone]  Leigh AuroraBueche, [name] Brown 09/30/2014, 12:32 PM

## 2014-09-30 NOTE — Procedures (Signed)
S/P 4 vessel cerebral arteriogram. RT CFA approach. Findings. 1.No occlusions,stenosis,dissections or AV shunting noted. 2.Venous outflow WNLs. 3.No aneurysms or AVM noted angiographically

## 2014-09-30 NOTE — Progress Notes (Signed)
Patient returned from endoscopy. Patient is oriented x4, drowsy. 98% Sao2 on room air. Patient's family at bedside. Patient is now resting in bed. Call bell within reach. Will continue to monitor closely.

## 2014-09-30 NOTE — CV Procedure (Signed)
TEE:  50 mg benedryl  3 mg versed  Normal valves Normal LV EF 60% No LAA thrombus Very small PFO by color flow Positive bubble study only with valsalva Normal RV Normal aorta No source of embolus  Jeffrey HawsPeter Mack Gilmore

## 2014-09-30 NOTE — Progress Notes (Signed)
STROKE TEAM PROGRESS NOTE   HISTORY Jeffrey Gilmore is an 24 y.o. male with a past medical history significant for albinism and hypoglycemic reaction, who was in his usual state of health until 9 pm yesterday 09/28/2014 when suddenly developed difficulty speaking, face-mouth numbness (can not pinpoint if left or right side) and weakness of the left hand. He said that he woke up from a nap and later on developed such symptoms. He became very scared and went to his mother room who noticed that his face was drooped and couldn't speak fluently. Mother said that he last saw him normal around 2 pm 09/28/2014 when he left the house to meet some friends and he told her that he smoked some marijuana. His symptoms lasted approximately 45 minutes and completely resolved. Denies recent head or neck trauma, HA, vertigo, double vision, difficulty swallowing, unsteadiness, confusion, or vision impairment. MRI brain was reviewed and demonstrated a small acute infarction involving the cortical gray matter and underlying white matter of the right frontal lobe. Patient was not administered TPA secondary to being out of the window, symptoms resolved. He was admitted for further evaluation and treatment.   SUBJECTIVE (INTERVAL HISTORY) His mother and aunt are at the bedside. Pt is sleeping. No recurrent symptoms. TEE done showed small PFO. DVT negative. And cerebral angio done showed no abnormalities. UDS negative. Pt mom admits that pt taking some powerful drinks.   OBJECTIVE Temp:  [98 F (36.7 C)-98.4 F (36.9 C)] 98.1 F (36.7 C) (05/10 0828) Pulse Rate:  [54-77] 63 (05/10 0828) Cardiac Rhythm:  [-] Normal sinus rhythm (05/10 0807) Resp:  [16-18] 18 (05/10 0828) BP: (106-121)/(56-74) 118/74 mmHg (05/10 0828) SpO2:  [94 %-98 %] 97 % (05/10 0828) Weight:  [83.008 kg (183 lb)] 83.008 kg (183 lb) (05/10 0828)  No results for input(s): GLUCAP in the last 168 hours.  Recent Labs Lab 09/29/14 0426  NA 138  K 3.8  CL  105  CO2 25  GLUCOSE 92  BUN 12  CREATININE 0.99  CALCIUM 9.2   No results for input(s): AST, ALT, ALKPHOS, BILITOT, PROT, ALBUMIN in the last 168 hours.  Recent Labs Lab 09/29/14 0426  WBC 8.1  NEUTROABS 4.6  HGB 13.9  HCT 41.8  MCV 92.5  PLT 212   No results for input(s): CKTOTAL, CKMB, CKMBINDEX, TROPONINI in the last 168 hours.  Recent Labs  09/30/14 0607  LABPROT 13.8  INR 1.05   No results for input(s): COLORURINE, LABSPEC, PHURINE, GLUCOSEU, HGBUR, BILIRUBINUR, KETONESUR, PROTEINUR, UROBILINOGEN, NITRITE, LEUKOCYTESUR in the last 72 hours.  Invalid input(s): APPERANCEUR     Component Value Date/Time   CHOL 140 09/30/2014 0607   TRIG 74 09/30/2014 0607   HDL 38* 09/30/2014 0607   CHOLHDL 3.7 09/30/2014 0607   VLDL 15 09/30/2014 0607   LDLCALC 87 09/30/2014 0607   Lab Results  Component Value Date   HGBA1C 5.6 09/29/2014      Component Value Date/Time   LABOPIA NONE DETECTED 09/29/2014 1130   COCAINSCRNUR NONE DETECTED 09/29/2014 1130   LABBENZ NONE DETECTED 09/29/2014 1130   AMPHETMU NONE DETECTED 09/29/2014 1130   THCU NONE DETECTED 09/29/2014 1130   LABBARB NONE DETECTED 09/29/2014 1130    No results for input(s): ETH in the last 168 hours.   Dg Chest 2 View 09/29/2014   No active cardiopulmonary disease.     Mr Brain Wo Contrast 09/29/2014   1. Approximately 2 cm acute linear ischemic infarct involving the cortical gray  matter and underlying white matter of the right frontal lobe as above. No associated hemorrhage or mass effect. 2. Otherwise normal brain MRI.   Ct Angio Head and neck W/cm &/or Wo Cm 09/29/2014   IMPRESSION: Right frontal lobe infarct better demonstrated on recent MR.  No medium or large size vessel significant stenosis. No obvious changes of vasculitis (subtle branch vessel vasculitis changes may not be detected by CT angiography).  Slightly prominent vessels adjacent to the cavernous sinus region greater on right may be related to  the late phase of injection rather than vascular malformation. To help confirm this and exclude underlying vascular malformation, MR angiogram and MR venography can be obtained.  Prominent soft tissue anterior superior mediastinum most likely thymus. Prominent lymphoid tissue may be reactive in origin related to recent infection. More worrisome process such as lymphoma felt to be unlikely in this setting of normal CBC and differential.     LE venous Doppler negative for DVT  TEE Normal valves. Normal LV EF 60%. No LAA thrombus. Very small PFO by color flow. Positive bubble study only with valsalva. Normal RV. Normal aorta. No source of embolus.  Cerebral angio S/P 4 vessel cerebral arteriogram. RT CFA approach. 1.No occlusions,stenosis,dissections or AV shunting noted. 2.Venous outflow WNLs. 3.No aneurysms or AVM noted angiographically   PHYSICAL EXAM  Temp:  [98 F (36.7 C)-98.4 F (36.9 C)] 98.1 F (36.7 C) (05/10 0828) Pulse Rate:  [54-77] 63 (05/10 0828) Resp:  [16-18] 18 (05/10 0828) BP: (106-121)/(56-74) 118/74 mmHg (05/10 0828) SpO2:  [94 %-98 %] 97 % (05/10 0828) Weight:  [83.008 kg (183 lb)] 83.008 kg (183 lb) (05/10 0828)  General - Well nourished, well developed, in no apparent distress.  Ophthalmologic - Sharp disc margins OU.   Cardiovascular - Regular rate and rhythm with no murmur.  Mental Status -  Level of arousal and orientation to time, place, and person were intact. Language including expression, naming, repetition, comprehension was assessed and found intact. Attention span and concentration were normal. Recent and remote memory were intact. Fund of Knowledge was assessed and was intact.  Cranial Nerves II - XII - II - Visual field intact OU. III, IV, VI - Extraocular movements intact. V - Facial sensation intact bilaterally. VII - Facial movement intact bilaterally. VIII - Hearing & vestibular intact bilaterally. X - Palate elevates symmetrically. XI -  Chin turning & shoulder shrug intact bilaterally. XII - Tongue protrusion intact.  Motor Strength - The patient's strength was normal in all extremities and pronator drift was absent.  Bulk was normal and fasciculations were absent.   Motor Tone - Muscle tone was assessed at the neck and appendages and was normal.  Reflexes - The patient's reflexes were 1+ in all extremities and he had no pathological reflexes.  Sensory - Light touch, temperature/pinprick, vibration and proprioception, and Romberg testing were assessed and were symmetrical.    Coordination - The patient had normal movements in the hands and feet with no ataxia or dysmetria.  Tremor was absent.  Gait and Station - deferred due to fatigue.   ASSESSMENT/PLAN Mr. Jeffrey Gilmore is a 24 y.o. male with history of albinism and hypoglycemic reaction presenting with transient dysarthria, left face numbness-weakness, left hand weakness. He did not receive IV t-PA due to delay in arrival, symptoms resolved.   Stroke:  MRI read as Non-dominant right frontal lobe linear-shaped infarct, etiology unclear, source not found. So far stroke work up only positive for a very small  PFO.   Resultant  Neuro deficits resolved  MRI  Read as R frontal lobe linear-shaped  infarct  TEE with very small PFO, no vegetations or thrombus  LE venous doppler no DVT  UDS negative   Cerebral angiogram negative for AV fistula or vasculitis  Ordered Hypercoagulable, vasculitic and array of labs to look for possible stroke source. Results pending.   LDL 87   HgbA1c 5.6  Lovenox 30 mg sq daily for VTE prophylaxis Diet NPO time specified Except for: Sips with Meds  no antithrombotic prior to admission, now on aspirin 325 mg orally every day. Would continue aspirin  daily at discharge given PFO.   Ongoing aggressive stroke risk factor management  Therapy recommendations:  No therapy needs   Disposition:  Return home  [facility] for discharge from  stroke standpoint  Follow up Dr. Roda Shutters in 2 months. Order written.  Hyperlipidemia  LDL 87  Not on statin prior to admission  Add low dose statin  Other Stroke Risk Factors  ETOH use  Marijuana use.   UDS negative  [facility] day # 1  Rhoderick Moody [facility] See Amion for Pager information 09/30/2014 8:58 AM  I, the attending vascular neurologist, have personally obtained a history, examined the patient, evaluated laboratory data, individually viewed imaging studies and agree with radiology interpretations. I also obtained additional history from pt's mom at bedside. I also discussed with Dr. Catha Gosselin regarding his care plan. Together with the NP/PA, we formulated the assessment and plan of care which reflects our mutual decision.  I have made any additions or clarifications directly to the above note and agree with the findings and plan as currently documented.   A 23 year old male with no significant past medical history presented with transient slurred speech, left hand and face numbness and weakness. Symptoms resolved. MRI showed right frontal linear-shaped infarct. Patient stated that he used marijuana before the episode, however UDS negative so far. Had extensive stroke work up so far only showed very small PFO. DVT negative. Hypercoagulable work up pending. Etiology not clear. will continue aspirin and low dose statin and follow up in clinic.  Marvel Plan, MD PhD Stroke Neurology 09/30/2014 2:24 PM    To contact Stroke Continuity provider, please refer to [email or web address]. After hours, contact General Neurology

## 2014-09-30 NOTE — Discharge Instructions (Signed)
STROKE/TIA DISCHARGE INSTRUCTIONS SMOKING Cigarette smoking nearly doubles your risk of having a stroke & is the single most alterable risk factor  If you smoke or have smoked in the last 12 months, you are advised to quit smoking for your health.  Most of the excess cardiovascular risk related to smoking disappears within a year of stopping.  Ask you doctor about anti-smoking medications  [facility] Quit Line: 1-800-QUIT NOW  Free Smoking Cessation Classes [phone]  CHOLESTEROL Know your levels; limit fat & cholesterol in your diet  Lipid Panel     Component Value Date/Time   CHOL 140 09/30/2014 0607   TRIG 74 09/30/2014 0607   HDL 38* 09/30/2014 0607   CHOLHDL 3.7 09/30/2014 0607   VLDL 15 09/30/2014 0607   LDLCALC 87 09/30/2014 0607      Many patients benefit from treatment even if their cholesterol is at goal.  Goal: Total Cholesterol (CHOL) less than 160  Goal:  Triglycerides (TRIG) less than 150  Goal:  HDL greater than 40  Goal:  LDL (LDLCALC) less than 100   BLOOD PRESSURE American Stroke Association blood pressure target is less that 120/80 mm/Hg  Your discharge blood pressure is:  BP: 116/66 mmHg  Monitor your blood pressure  Limit your salt and alcohol intake  Many individuals will require more than one medication for high blood pressure  DIABETES (A1c is a blood sugar average for last 3 months) Goal HGBA1c is under 7% (HBGA1c is blood sugar average for last 3 months)  Diabetes: No known diagnosis of diabetes    Lab Results  Component Value Date   HGBA1C 5.6 09/29/2014     Your HGBA1c can be lowered with medications, healthy diet, and exercise.  Check your blood sugar as directed by your physician  Call your physician if you experience unexplained or low blood sugars.  PHYSICAL ACTIVITY/REHABILITATION Goal is 30 minutes at least 4 days per week  Activity: Increase activity slowly, Therapies: None  Activity decreases your risk of heart attack and stroke  and makes your heart stronger.  It helps control your weight and blood pressure; helps you relax and can improve your mood.  Participate in a regular exercise program.  Talk with your doctor about the best form of exercise for you (dancing, walking, swimming, cycling).  DIET/WEIGHT Goal is to maintain a healthy weight  Your discharge diet is: Diet Heart Room service appropriate?: Yes; Fluid consistency:: Thin, liquids Your height is:  Height: 6\' 1"  (185.4 cm) Your current weight is: Weight: 83.008 kg (183 lb) Your Body Mass Index (BMI) is:  BMI (Calculated): 24.2  Following the type of diet specifically designed for you will help prevent another stroke.  Your goal Body Mass Index (BMI) is 19-24.  Healthy food habits can help reduce 3 risk factors for stroke:  High cholesterol, hypertension, and excess weight.  RESOURCES Stroke/Support Group:  Call [phone]   STROKE EDUCATION PROVIDED/REVIEWED AND GIVEN TO PATIENT Stroke warning signs and symptoms How to activate emergency medical system (call 911). Medications prescribed at discharge. Need for follow-up after discharge. Personal risk factors for stroke. Pneumonia vaccine given: No Flu vaccine given: No My questions have been answered, the writing is legible, and I understand these instructions.  I will adhere to these goals & educational materials that have been provided to me after my discharge from the hospital.                To Whom It May Conern: Please excuse  Mr. & Mrs. Fraser Dinixon from work, as their son, Mr. Lyda Kalataory Tant was admitted to the Hospital from 09/28/2014 to 09/30/2014 and was discharged on 09/30/2014.   Call [facility] office [phone] for any questions.  Sincerely, Edsel PetrinMaryann Mikhail, DO [facility] 09/30/2014, 2:07 PM                                     .  To whom it concern:  Mr. Lyda Kalataory Doughtie  was admitted to the Hospital from 09/28/2014 to 09/30/2014 and was  discharged on 09/30/2014. Mr. Lyda Kalataory Boehner should be excused from work starting5/12/2014 and may return to work without any restrictions on 10/03/2014.  Call [facility] office [phone] for any questions. Sincerely, Edsel PetrinMaryann Mikhail, DO [facility] 09/30/2014, 5:00 PM

## 2014-09-30 NOTE — Evaluation (Signed)
Occupational Therapy Evaluation Patient Details Name: Jeffrey Gilmore MRN: [id number] DOB: 1991-03-20 Today's Date: 09/30/2014    History of Present Illness 24 y.o. 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 Imaging revealed 2 cm acute linear ischemic infarct involving cortical gray matter and underlying white matter, right frontal lobe.   Clinical Impression   Patient evaluated by Occupational Therapy with no further acute OT needs identified. All education has been completed and the patient has no further questions. See below for any follow-up Occupational Therapy or equipment needs. OT to sign off. Thank you for referral.   Pt was able to verbalize using teach back all stroke warning signs     Follow Up Recommendations  No OT follow up    Equipment Recommendations  None recommended by OT    Recommendations for Other Services       Precautions / Restrictions Precautions Precautions: None      Mobility Bed Mobility Overal bed mobility: Independent             General bed mobility comments: on EOB sitting on arrival  Transfers Overall transfer level: Independent                    Balance Overall balance assessment: No apparent balance deficits (not formally assessed)                                          ADL Overall ADL's : Independent;At baseline                                             Vision Additional Comments: wears glasses at all times. Pt able to read print on board in room and scan environment during evaluation without deficits   Perception Perception Perception Tested?: No   Praxis      Pertinent Vitals/Pain Pain Assessment: No/denies pain     Hand Dominance Left   Extremity/Trunk Assessment Upper Extremity Assessment Upper Extremity Assessment: Overall WFL for tasks assessed   Lower  Extremity Assessment Lower Extremity Assessment: Overall WFL for tasks assessed   Cervical / Trunk Assessment Cervical / Trunk Assessment: Normal   Communication Communication Communication: No difficulties   Cognition Arousal/Alertness: Awake/alert Behavior During Therapy: WFL for tasks assessed/performed Overall Cognitive Status: Within Functional Limits for tasks assessed                     General Comments       Exercises       Shoulder Instructions      Home Living Family/patient expects to be discharged to:: Private residence Living Arrangements: Other relatives (lives with brother) Available Help at Discharge: Family;Friend(s) Type of Home: Apartment Home Access: Stairs to enter Secretary/administratorntrance Stairs-Number of Steps: 16 Entrance Stairs-Rails: Can reach both Home Layout: One level               Home Equipment: Crutches   Additional Comments: tub, standard height toilets      Prior Functioning/Environment Level of Independence: Independent             OT Diagnosis:     OT Problem List:     OT Treatment/Interventions:  OT Goals(Current goals can be found in the care plan section) Acute Rehab OT Goals Patient Stated Goal: to go home  OT Frequency:     Barriers to D/C:            Co-evaluation              End of Session Equipment Utilized During Treatment: Gait belt  Activity Tolerance: Patient tolerated treatment well Patient left: in bed;with call bell/phone within reach;with family/visitor present   Time: [id number] OT Time Calculation (min): 16 min Charges:  OT General Charges $OT Visit: 1 Procedure OT Evaluation $Initial OT Evaluation Tier I: 1 Procedure G-Codes:    Harolyn RutherfordJones, Lonny Eisen B 09/30/2014, 8:53 AM  Pager: [phone]

## 2014-09-30 NOTE — Progress Notes (Signed)
  Echocardiogram Echocardiogram Transesophageal has been performed.  Janalyn HarderWest, Michaeal Davis R 09/30/2014, 9:44 AM

## 2014-09-30 NOTE — Interval H&P Note (Signed)
History and Physical Interval Note:  09/30/2014 8:03 AM  Jeffrey Gilmore  has presented today for surgery, with the diagnosis of stroke  The various methods of treatment have been discussed with the patient and family. After consideration of risks, benefits and other options for treatment, the patient has consented to  Procedure(s): TRANSESOPHAGEAL ECHOCARDIOGRAM (TEE) (N/A) as a surgical intervention .  The patient's history has been reviewed, patient examined, no change in status, stable for surgery.  I have reviewed the patient's chart and labs.  Questions were answered to the patient's satisfaction.     Charlton HawsPeter Damonie Furney

## 2014-09-30 NOTE — Progress Notes (Signed)
Patient discharged home with parents. RN discussed discharge instructions, medications, and follow up instructions with patient. Patient's pressure dressing intact, no drainage. Patient layed down flat for 3 hours. Patient then ambulated with RN. Tolerated well. Patient instructed regarding pressure dressing to not get wet or soiled. Discharge instructions and work letter given to patient and parents of patient. IV removed. Patient driven home by parents. EMMI form filled out, [facility].

## 2014-10-01 ENCOUNTER — Encounter (HOSPITAL_COMMUNITY): Payer: Self-pay | Admitting: Cardiovascular Disease

## 2014-10-01 LAB — CARDIOLIPIN ANTIBODIES, IGG, IGM, IGA: Anticardiolipin IgG: <9 GPL U/mL (ref 0–14)

## 2014-10-01 LAB — FACTOR 5 LEIDEN

## 2014-10-01 LAB — ANTI-DNA ANTIBODY, DOUBLE-STRANDED

## 2014-10-01 LAB — BETA-2-GLYCOPROTEIN I ABS, IGG/M/A
Beta-2-Glycoprotein I IgA: <9 GPI IgA units (ref 0–25)
Beta-2-Glycoprotein I IgM: <9 GPI IgM units (ref 0–32)

## 2014-10-01 LAB — SJOGRENS SYNDROME-A EXTRACTABLE NUCLEAR ANTIBODY: SSA (Ro) (ENA) Antibody, IgG: <1

## 2014-10-01 LAB — SJOGRENS SYNDROME-B EXTRACTABLE NUCLEAR ANTIBODY: SSB (La) (ENA) Antibody, IgG: <1

## 2014-10-02 LAB — S CEREVISIAE ABS
S cerevisiae IgA ab: 8.7 U (ref <=20.0)
S cerevisiae IgG ab: 9.2 U (ref <=20.0)

## 2014-10-02 LAB — PROTEIN C, TOTAL: Protein C, Total: 80 % (ref 70–140)

## 2014-10-02 LAB — PROTHROMBIN GENE MUTATION

## 2014-10-07 ENCOUNTER — Encounter (HOSPITAL_COMMUNITY): Payer: Self-pay

## 2014-10-07 ENCOUNTER — Emergency Department (HOSPITAL_COMMUNITY)
Admission: EM | Admit: 2014-10-07 | Discharge: 2014-10-07 | Disposition: A | Discharge status: Home / Self Care | Payer: Medicaid Other | Attending: Emergency Medicine | Admitting: Emergency Medicine

## 2014-10-07 ENCOUNTER — Emergency Department (HOSPITAL_COMMUNITY): Payer: Medicaid Other

## 2014-10-07 DIAGNOSIS — Z8639 Personal history of other endocrine, nutritional and metabolic disease: Secondary | ICD-10-CM | POA: Diagnosis not present

## 2014-10-07 DIAGNOSIS — Z79899 Other long term (current) drug therapy: Secondary | ICD-10-CM | POA: Insufficient documentation

## 2014-10-07 DIAGNOSIS — Z880 Allergy status to penicillin: Secondary | ICD-10-CM | POA: Insufficient documentation

## 2014-10-07 DIAGNOSIS — Z7982 Long term (current) use of aspirin: Secondary | ICD-10-CM | POA: Diagnosis not present

## 2014-10-07 DIAGNOSIS — R0602 Shortness of breath: Secondary | ICD-10-CM | POA: Diagnosis present

## 2014-10-07 LAB — BASIC METABOLIC PANEL
ANION GAP: 8 (ref 5–15)
BUN: 9 mg/dL (ref 6–20)
CALCIUM: 9.9 mg/dL (ref 8.9–10.3)
CO2: 27 mmol/L (ref 22–32)
Chloride: 105 mmol/L (ref 101–111)
Creatinine, Ser: 1.1 mg/dL (ref 0.61–1.24)
Glucose, Bld: 96 mg/dL (ref 65–99)
POTASSIUM: 4.3 mmol/L (ref 3.5–5.1)
SODIUM: 140 mmol/L (ref 135–145)

## 2014-10-07 LAB — CBC WITH DIFFERENTIAL/PLATELET
Basophils Absolute: 0.1 10*3/uL (ref 0.0–0.1)
Basophils Relative: 1 % (ref 0–1)
EOS ABS: 0.2 10*3/uL (ref 0.0–0.7)
EOS PCT: 4 % (ref 0–5)
HCT: 43.7 % (ref 39.0–52.0)
HEMOGLOBIN: 14.8 g/dL (ref 13.0–17.0)
LYMPHS PCT: 28 % (ref 12–46)
Lymphs Abs: 1.7 10*3/uL (ref 0.7–4.0)
MCH: 30.8 pg (ref 26.0–34.0)
MCHC: 33.9 g/dL (ref 30.0–36.0)
MCV: 91 fL (ref 78.0–100.0)
Monocytes Absolute: 0.4 10*3/uL (ref 0.1–1.0)
Monocytes Relative: 7 % (ref 3–12)
Neutro Abs: 3.5 10*3/uL (ref 1.7–7.7)
Neutrophils Relative %: 60 % (ref 43–77)
Platelets: 222 10*3/uL (ref 150–400)
RBC: 4.8 MIL/uL (ref 4.22–5.81)
RDW: 12.5 % (ref 11.5–15.5)
WBC: 5.8 10*3/uL (ref 4.0–10.5)

## 2014-10-07 LAB — D-DIMER, QUANTITATIVE (NOT AT ARMC): D DIMER QUANT: 0.56 ug{FEU}/mL — AB (ref 0.00–0.48)

## 2014-10-07 LAB — I-STAT TROPONIN, ED: Troponin i, poc: 0 ng/mL (ref 0.00–0.08)

## 2014-10-07 MED ORDER — IOHEXOL 350 MG/ML SOLN
80.0000 mL | Freq: Once | INTRAVENOUS | Status: AC | PRN
  Administered 2014-10-07: 80 mL via INTRAVENOUS

## 2014-10-07 MED ORDER — LORAZEPAM 1 MG PO TABS
1.0000 mg | ORAL_TABLET | Freq: Once | ORAL | Status: AC
  Administered 2014-10-07: 1 mg via ORAL
  Filled 2014-10-07: qty 1

## 2014-10-07 NOTE — ED Notes (Signed)
Patient transported to CT 

## 2014-10-07 NOTE — ED Provider Notes (Signed)
CSN: [id number]     Arrival date & time 10/07/14  1008 History   First MD Initiated Contact with Patient 10/07/14 1017     Chief Complaint  Patient presents with  . Shortness of Breath     (Consider location/radiation/quality/duration/timing/severity/associated sxs/prior Treatment) HPI Comments: Patient with past medical history of stroke, albinism, and hypoglycemic episodes presents to the emergency department with chief complaint of shortness of breath. He was admitted last week for stroke. During his workup he had a MRI brain which showed a 2 cm acute linear ischemic infarct, CTA head/neck with right frontal lobe infarct, no medium or large size vessel stenosis, no vasculitis, echocardiogram/TEE with EF 60%, no LAA thrombus, small PFO, positive bubble study with valsalva, and cerebral angiogram with no occlusions, stenosis, dissection or AV shunting, venous outflow normal, no aneurysms or AVMs. Patient was seen today by his primary care provider, who instructed him to come to the emergency department for persistent shortness of breath. Patient states shortness of breath has been persistent since being released from the hospital. He states that nothing makes his symptoms better, but they're worsened with agitation and when he becomes anxious or angry. He states that it is hard to catch his breath. Additionally, patient has been under an immense amount of stress and pressure because of his recent hospitalization, also because of family situations at home. He has not taken anything to alleviate his symptoms. He denies any chest pain. Denies any nausea, vomiting, diarrhea, or constipation.  The history is provided by the patient. No language interpreter was used.    Past Medical History  Diagnosis Date  . Hypoglycemic reaction    Past Surgical History  Procedure Laterality Date  . Eye surgery    . Tee without cardioversion N/A 09/30/2014    Procedure: TRANSESOPHAGEAL ECHOCARDIOGRAM (TEE);   Surgeon: Wendall Stade, MD;  Location: [facility] ENDOSCOPY;  Service: Cardiovascular;  Laterality: N/A;   No family history on file. History  Substance Use Topics  . Smoking status: Never Smoker   . Smokeless tobacco: Never Used  . Alcohol Use: Yes     Comment: occasionally     Review of Systems  Constitutional: Negative for fever and chills.  Respiratory: Positive for shortness of breath.   Cardiovascular: Negative for chest pain.  Gastrointestinal: Negative for nausea, vomiting, diarrhea and constipation.  Genitourinary: Negative for dysuria.  All other systems reviewed and are negative.     Allergies  Amoxicillin  Home Medications   Prior to Admission medications   Medication Sig Start Date End Date Taking? Authorizing Provider  aspirin EC 81 MG EC tablet Take 1 tablet (81 mg total) by mouth daily. 09/30/14   Maryann Mikhail, DO  senna-docusate (SENOKOT-S) 8.6-50 MG per tablet Take 1 tablet by mouth at bedtime as needed for mild constipation. 09/30/14   Maryann Mikhail, DO  simvastatin (ZOCOR) 10 MG tablet Take 1 tablet (10 mg total) by mouth daily at 6 PM. 09/30/14   Maryann Mikhail, DO   BP 115/63 mmHg  Pulse 69  Temp(Src) 97.8 F (36.6 C) (Oral)  Resp 16  SpO2 99% Physical Exam  Constitutional: He is oriented to person, place, and time. He appears well-developed and well-nourished.  HENT:  Head: Normocephalic and atraumatic.  Eyes: Conjunctivae and EOM are normal. Pupils are equal, round, and reactive to light. Right eye exhibits no discharge. Left eye exhibits no discharge. No scleral icterus.  Neck: Normal range of motion. Neck supple. No JVD present.  Cardiovascular: Normal  rate, regular rhythm and normal heart sounds.  Exam reveals no gallop and no friction rub.   No murmur heard. Pulmonary/Chest: Effort normal and breath sounds normal. No respiratory distress. He has no wheezes. He has no rales. He exhibits no tenderness.  Abdominal: Soft. He exhibits no distension  and no mass. There is no tenderness. There is no rebound and no guarding.  Musculoskeletal: Normal range of motion. He exhibits no edema or tenderness.  Neurological: He is alert and oriented to person, place, and time.  Skin: Skin is warm and dry.  Psychiatric: He has a normal mood and affect. His behavior is normal. Judgment and thought content normal.  Nursing note and vitals reviewed.   ED Course  Procedures (including critical care time) Results for orders placed or performed during the hospital encounter of 10/07/14  CBC with Differential/Platelet  Result Value Ref Range   WBC 5.8 4.0 - 10.5 K/uL   RBC 4.80 4.22 - 5.81 MIL/uL   Hemoglobin 14.8 13.0 - 17.0 g/dL   HCT 86.543.7 78.439.0 - 69.652.0 %   MCV 91.0 78.0 - 100.0 fL   MCH 30.8 26.0 - 34.0 pg   MCHC 33.9 30.0 - 36.0 g/dL   RDW 29.512.5 28.411.5 - 13.215.5 %   Platelets 222 150 - 400 K/uL   Neutrophils Relative % 60 43 - 77 %   Neutro Abs 3.5 1.7 - 7.7 K/uL   Lymphocytes Relative 28 12 - 46 %   Lymphs Abs 1.7 0.7 - 4.0 K/uL   Monocytes Relative 7 3 - 12 %   Monocytes Absolute 0.4 0.1 - 1.0 K/uL   Eosinophils Relative 4 0 - 5 %   Eosinophils Absolute 0.2 0.0 - 0.7 K/uL   Basophils Relative 1 0 - 1 %   Basophils Absolute 0.1 0.0 - 0.1 K/uL  D-dimer, quantitative  Result Value Ref Range   D-Dimer, Quant 0.56 (H) 0.00 - 0.48 ug/mL-FEU  Basic metabolic panel  Result Value Ref Range   Sodium 140 135 - 145 mmol/L   Potassium 4.3 3.5 - 5.1 mmol/L   Chloride 105 101 - 111 mmol/L   CO2 27 22 - 32 mmol/L   Glucose, Bld 96 65 - 99 mg/dL   BUN 9 6 - 20 mg/dL   Creatinine, Ser 4.401.10 0.61 - 1.24 mg/dL   Calcium 9.9 8.9 - 10.210.3 mg/dL   GFR calc non Af Amer >60 >60 mL/min   GFR calc Af Amer >60 >60 mL/min   Anion gap 8 5 - 15  I-stat troponin, ED  Result Value Ref Range   Troponin i, poc 0.00 0.00 - 0.08 ng/mL   Comment 3           Ct Angio Head W/cm &/or Wo Cm  09/29/2014   CLINICAL DATA:  24 year old male presenting with left hand numbness and  left facial droop yesterday which lasted for 45 minutes. Subsequent encounter.  EXAM: CT ANGIOGRAPHY HEAD AND NECK  TECHNIQUE: Multidetector CT imaging of the head and neck was performed using the standard protocol during bolus administration of intravenous contrast. Multiplanar CT image reconstructions and MIPs were obtained to evaluate the vascular anatomy. Carotid stenosis measurements (when applicable) are obtained utilizing NASCET criteria, using the distal internal carotid diameter as the denominator.  CONTRAST:  50mL OMNIPAQUE IOHEXOL 350 MG/ML SOLN  COMPARISON:  09/29/2014 MR.  FINDINGS: CT HEAD  Brain: Small right frontal lobe infarct better delineated on recent brain MR. no intracranial hemorrhage. No intracranial enhancing lesion.  Calvarium and skull base: Negative.  Paranasal sinuses: Maxillary sinus slightly polypoid mucosal thickening greater on the right.  Orbits: Negative.  CTA NECK  Aortic arch: 3 vessel  Right carotid system: No significant stenosis or irregularity.  Left carotid system: No significant stenosis or irregularity.  Vertebral arteries:No significant stenosis or irregularity.  Skeleton: Negative.  Other neck: Prominent soft tissue anterior superior mediastinum most likely thymus. Prominence of lymph nodes throughout the neck, largest left level 2 region with short axis dimension of 1.6 cm. Prominent Waldeyer's ring. This prominent lymphoid tissue may be reactive in origin related to recent infection. More worrisome process such as lymphoma felt to be less likely in this setting of normal CBC and differential.  CTA HEAD  Anterior circulation: No medium or large size vessel significant stenosis. Slightly prominent vessels adjacent to the cavernous sinus region greater on right may be related to the late phase of injection rather than vascular malformation.  Posterior circulation:  No significant stenosis  Venous sinuses: As above  Anatomic variants: As above  Delayed phase: As above   IMPRESSION: Right frontal lobe infarct better demonstrated on recent MR.  No medium or large size vessel significant stenosis. No obvious changes of vasculitis (subtle branch vessel vasculitis changes may not be detected by CT angiography).  Slightly prominent vessels adjacent to the cavernous sinus region greater on right may be related to the late phase of injection rather than vascular malformation. To help confirm this and exclude underlying vascular malformation, MR angiogram and MR venography can be obtained.  Prominent soft tissue anterior superior mediastinum most likely thymus. Prominent lymphoid tissue may be reactive in origin related to recent infection. More worrisome process such as lymphoma felt to be unlikely in this setting of normal CBC and differential.   Electronically Signed   By: Lacy Duverney M.D.   On: 09/29/2014 09:28   Dg Chest 2 View  10/07/2014   CLINICAL DATA:  Tachycardia, stroke last week  EXAM: CHEST  2 VIEW  COMPARISON:  09/29/2014  FINDINGS: Cardiomediastinal silhouette is stable. No acute infiltrate or pleural effusion. No pulmonary edema. Bony thorax is unremarkable.  IMPRESSION: No active cardiopulmonary disease.   Electronically Signed   By: Natasha Mead M.D.   On: 10/07/2014 10:50   Dg Chest 2 View  09/29/2014   CLINICAL DATA:  Stroke  EXAM: CHEST  2 VIEW  COMPARISON:  07/23/2014  FINDINGS: Cardiomediastinal silhouette is stable. No acute infiltrate or pleural effusion. No pulmonary edema. Mild hyperinflation.  IMPRESSION: No active cardiopulmonary disease.   Electronically Signed   By: Natasha Mead M.D.   On: 09/29/2014 08:02   Ct Angio Neck W/cm &/or Wo/cm  09/29/2014   CLINICAL DATA:  24 year old male presenting with left hand numbness and left facial droop yesterday which lasted for 45 minutes. Subsequent encounter.  EXAM: CT ANGIOGRAPHY HEAD AND NECK  TECHNIQUE: Multidetector CT imaging of the head and neck was performed using the standard protocol during bolus  administration of intravenous contrast. Multiplanar CT image reconstructions and MIPs were obtained to evaluate the vascular anatomy. Carotid stenosis measurements (when applicable) are obtained utilizing NASCET criteria, using the distal internal carotid diameter as the denominator.  CONTRAST:  50mL OMNIPAQUE IOHEXOL 350 MG/ML SOLN  COMPARISON:  09/29/2014 MR.  FINDINGS: CT HEAD  Brain: Small right frontal lobe infarct better delineated on recent brain MR. no intracranial hemorrhage. No intracranial enhancing lesion.  Calvarium and skull base: Negative.  Paranasal sinuses: Maxillary sinus slightly polypoid mucosal  thickening greater on the right.  Orbits: Negative.  CTA NECK  Aortic arch: 3 vessel  Right carotid system: No significant stenosis or irregularity.  Left carotid system: No significant stenosis or irregularity.  Vertebral arteries:No significant stenosis or irregularity.  Skeleton: Negative.  Other neck: Prominent soft tissue anterior superior mediastinum most likely thymus. Prominence of lymph nodes throughout the neck, largest left level 2 region with short axis dimension of 1.6 cm. Prominent Waldeyer's ring. This prominent lymphoid tissue may be reactive in origin related to recent infection. More worrisome process such as lymphoma felt to be less likely in this setting of normal CBC and differential.  CTA HEAD  Anterior circulation: No medium or large size vessel significant stenosis. Slightly prominent vessels adjacent to the cavernous sinus region greater on right may be related to the late phase of injection rather than vascular malformation.  Posterior circulation:  No significant stenosis  Venous sinuses: As above  Anatomic variants: As above  Delayed phase: As above  IMPRESSION: Right frontal lobe infarct better demonstrated on recent MR.  No medium or large size vessel significant stenosis. No obvious changes of vasculitis (subtle branch vessel vasculitis changes may not be detected by CT  angiography).  Slightly prominent vessels adjacent to the cavernous sinus region greater on right may be related to the late phase of injection rather than vascular malformation. To help confirm this and exclude underlying vascular malformation, MR angiogram and MR venography can be obtained.  Prominent soft tissue anterior superior mediastinum most likely thymus. Prominent lymphoid tissue may be reactive in origin related to recent infection. More worrisome process such as lymphoma felt to be unlikely in this setting of normal CBC and differential.   Electronically Signed   By: Lacy DuverneySteven  Olson M.D.   On: 09/29/2014 09:28   Ct Angio Chest Pe W/cm &/or Wo Cm  10/07/2014   CLINICAL DATA:  24 year old male with 1 week history of shortness of breath which tends to accompany feelings of sadness, anxiety and anger.  EXAM: CT ANGIOGRAPHY CHEST WITH CONTRAST  TECHNIQUE: Multidetector CT imaging of the chest was performed using the standard protocol during bolus administration of intravenous contrast. Multiplanar CT image reconstructions and MIPs were obtained to evaluate the vascular anatomy.  CONTRAST:  80mL OMNIPAQUE IOHEXOL 350 MG/ML SOLN  COMPARISON:  Chest x-ray obtained earlier today ; remote prior abdominal ultrasound 10/24/2000  FINDINGS: Mediastinum: Unremarkable CT appearance of the thyroid gland. No suspicious mediastinal or hilar adenopathy. Triangular, strandy soft tissue in the anterior mediastinum consistent with residual thymus. The thoracic esophagus is unremarkable.  Heart/Vascular: Adequate opacification of the pulmonary arteries to the proximal subsegmental level. No evidence of central filling defect to suggest acute pulmonary embolus. Conventional 3 vessel arch anatomy. No aneurysm. The heart is within normal limits for size.  Lungs/Pleura: The lungs are clear.  No pleural effusion.  Bones/Soft Tissues: No acute fracture or aggressive appearing lytic or blastic osseous lesion.  Upper Abdomen:  Incompletely imaged 5 cm water attenuation cystic structure arising from the upper pole of the left kidney likely representing a large renal cyst. Correlation with prior ultrasound imaging demonstrated a 4.8 cm simple cyst in the upper pole of the left kidney in 2002. Otherwise, the visualized upper abdomen is unremarkable.  Review of the MIP images confirms the above findings.  IMPRESSION: 1. Negative for acute pulmonary embolus, pneumonia or other acute cardiopulmonary process. 2. Incompletely imaged large simple cyst in the upper pole of the left kidney. Correlation with prior  sonographic imaging from June of 2002 describes similar findings.   Electronically Signed   By: Malachy Moan M.D.   On: 10/07/2014 13:29   Mr Brain Wo Contrast  09/29/2014   CLINICAL DATA:  Initial evaluation for left hand and lip numbness. Now asymptomatic.  EXAM: MRI HEAD WITHOUT CONTRAST  TECHNIQUE: Multiplanar, multiecho pulse sequences of the brain and surrounding structures were obtained without intravenous contrast.  COMPARISON:  None.  FINDINGS: The CSF containing spaces are within normal limits for patient age. No focal parenchymal signal abnormality is identified. No mass lesion, midline shift, or extra-axial fluid collection. Ventricles are normal in size without evidence of hydrocephalus.  There is a linear abnormal focus of restricted diffusion involving the cortical gray matter an subjacent white matter within the right frontal lobe, consistent with an acute ischemic infarct (series 3, image 36). This is seen on coronal DWI sequence on series 4, image 15). Overall, this infarct measures approximately 2 cm in length. Associated signal dropout seen on ADC map. No associated hemorrhage or significant mass effect. No other infarct.  The cervicomedullary junction is normal. Pituitary gland is within normal limits. Pituitary stalk is midline. The globes and optic nerves demonstrate a normal appearance with normal signal  intensity. The  The bone marrow signal intensity is normal. Calvarium is intact. Visualized upper cervical spine is within normal limits.  Scalp soft tissues are unremarkable.  Mild mucosal thickening present within the maxillary sinuses and ethmoidal air cells. No air-fluid levels to suggest active sinus infection. No mastoid effusion. Inner ear structures normal.  IMPRESSION: 1. 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 No associated hemorrhage or mass effect. 2. Otherwise normal brain MRI.   Electronically Signed   By: Rise Mu M.D.   On: 09/29/2014 03:21   Ir Angio Intra Extracran Sel Com Carotid Innominate Bilat Mod Sed  10/01/2014   CLINICAL DATA:  Right cerebral hemispheric ischemic stroke. Vasculitis.  EXAM: BILATERAL COMMON CAROTID AND INNOMINATE ANGIOGRAPHY AND BILATERAL VERTEBRAL ARTERY ANGIOGRAMS  PROCEDURE: Contrast: 60 mL OMNIPAQUE IOHEXOL 300 MG/ML  SOLN  Anesthesia/Sedation:  Conscious sedation.  Medications: None.  Following a full explanation of the procedure along with the potential associated complications, an informed witnessed consent was obtained.  The right groin was prepped and draped in the usual sterile fashion. Thereafter using modified Seldinger technique, transfemoral access into the right common femoral artery was obtained without difficulty. Over a 0.035 inch guidewire, a 5 French Pinnacle sheath was inserted. Through this, and also over 0.035 inch guidewire, a 5 French JB1 catheter was advanced to the aortic arch region and selectively positioned in the right common carotid artery, the right vertebral artery, the left common carotid artery and the left vertebral artery.  There were no acute complications. The patient tolerated the procedure well.  FINDINGS: The right common carotid arteriogram demonstrates the right external carotid artery and its major branches to  be widely patent.  The right internal carotid artery at the bulb to the cranial skull base opacifies normally.  The petrous, cavernous, and the supraclinoid segments are normally opacified.  The right middle and the right anterior cerebral arteries opacify into the capillary and venous phases. The right vertebral artery origin is normal.  The vessel is seen to opacify normally to the cranial skull base. Normal opacification is seen of the right posterior inferior cerebellar artery and the right vertebrobasilar junction.  The opacified portions of the posterior cerebral arteries, the superior cerebellar arteries and the anterior inferior cerebellar arteries is normal into the capillary and venous phases. Non-opacified blood is seen in the basilar artery and the posterior cerebral arteries from the contralateral vertebral artery.  The left common carotid arteriogram demonstrates the left external carotid artery and its major branches to be widely patent.  The left internal carotid artery at the bulb to cranial skull base opacifies normally.  The petrous, cavernous, and the supraclinoid segments are widely patent.  The left middle and the left anterior cerebral arteries opacify normally into the capillary and venous phases.  The left vertebral artery origin is normal.  The vessel is seen to opacify normally to the cranial skull base. Normal opacification is seen of the left vertebrobasilar junction and the left posterior inferior cerebellar artery. The opacified basilar artery, the posterior cerebral arteries, the superior cerebellar arteries and the anterior inferior cerebellar arteries is normal into the capillary and venous phases. Again unopacified blood is seen in the basilar artery from the contralateral vertebral artery.  IMPRESSION: Angiographically no evidence of occlusions, stenoses, dissections, arteriovenous shunting, or of arteriovenous malformations or of aneurysms noted intracranially.  Venous outflow  within normal limits.   Electronically Signed   By: Julieanne Cotton M.D.   On: 09/30/2014 14:01   Ir Angio Vertebral Sel Vertebral Bilat Mod Sed  10/01/2014   CLINICAL DATA:  Right cerebral hemispheric ischemic stroke. Vasculitis.  EXAM: BILATERAL COMMON CAROTID AND INNOMINATE ANGIOGRAPHY AND BILATERAL VERTEBRAL ARTERY ANGIOGRAMS  PROCEDURE: Contrast: 60 mL OMNIPAQUE IOHEXOL 300 MG/ML  SOLN  Anesthesia/Sedation:  Conscious sedation.  Medications: None.  Following a full explanation of the procedure along with the potential associated complications, an informed witnessed consent was obtained.  The right groin was prepped and draped in the usual sterile fashion. Thereafter using modified Seldinger technique, transfemoral access into the right common femoral artery was obtained without difficulty. Over a 0.035 inch guidewire, a 5 French Pinnacle sheath was inserted. Through this, and also over 0.035 inch guidewire, a 5 French JB1 catheter was advanced to the aortic arch region and selectively positioned in the right common carotid artery, the right vertebral artery, the left common carotid artery and the left vertebral artery.  There were no acute complications. The patient tolerated the procedure well.  FINDINGS: The right common carotid arteriogram demonstrates the right external carotid artery and its major branches to be widely patent.  The right internal carotid artery at the bulb to the cranial skull base opacifies normally.  The petrous, cavernous, and the supraclinoid segments are normally opacified.  The right middle and the right anterior cerebral arteries opacify into the capillary and venous phases. The right vertebral artery origin is normal.  The vessel is seen to opacify normally to the cranial skull base. Normal opacification is seen of the right posterior inferior cerebellar artery and the right vertebrobasilar junction.  The opacified portions of the posterior cerebral arteries, the superior  cerebellar arteries and the anterior inferior cerebellar arteries is normal into the capillary and venous phases. Non-opacified blood is seen in the basilar artery and the posterior cerebral arteries from the contralateral vertebral artery.  The left common carotid arteriogram demonstrates the left external carotid artery and its major branches to be widely patent.  The left internal carotid artery at the bulb to cranial skull base opacifies normally.  The petrous, cavernous, and the supraclinoid segments are widely patent.  The left middle and the left  anterior cerebral arteries opacify normally into the capillary and venous phases.  The left vertebral artery origin is normal.  The vessel is seen to opacify normally to the cranial skull base. Normal opacification is seen of the left vertebrobasilar junction and the left posterior inferior cerebellar artery. The opacified basilar artery, the posterior cerebral arteries, the superior cerebellar arteries and the anterior inferior cerebellar arteries is normal into the capillary and venous phases. Again unopacified blood is seen in the basilar artery from the contralateral vertebral artery.  IMPRESSION: Angiographically no evidence of occlusions, stenoses, dissections, arteriovenous shunting, or of arteriovenous malformations or of aneurysms noted intracranially.  Venous outflow within normal limits.   Electronically Signed   By: Julieanne Cotton M.D.   On: 09/30/2014 14:01      EKG Interpretation None      MDM   Final diagnoses:  SOB (shortness of breath)    Patient with shortness of breath. Sent to ED by PCP for persistent shortness of breath following procedures during recent hospitalization. No hypoxia, no tachycardia, no history of PE or DVT. No unilateral leg swelling. He is low risk, therefore will check d-dimer. Will also check troponin, chest x-ray, and EKG. Suspect that the patient's symptoms are anxiety related, will treat with Ativan, and  will reassess.  D-dimer is mildly elevated, given recent admission and procedures, will check CT angiogram chest to rule out PE.  CT is negative, patient feels improved with Ativan. Suspect that his symptoms are anxiety related. Will discharge to home. Primary care follow-up is encouraged. Patient and mother understand and agree with the plan.  Roxy Horseman, PA-C 10/07/14 1405  Purvis Sheffield, MD 10/08/14 1248

## 2014-10-07 NOTE — ED Notes (Signed)
Pt. Having sob since May 10th.  Pt. Denies any chest pain.  Pt. Reports that he gets only sob with sadness, anxiety and anger.  No neuro deficits note.  GCS 15

## 2014-10-07 NOTE — Discharge Instructions (Signed)
Shortness of Breath Shortness of breath means you have trouble breathing. It could also mean that you have a medical problem. You should get immediate medical care for shortness of breath. CAUSES   Not enough oxygen in the air such as with high altitudes or a smoke-filled room.  Certain lung diseases, infections, or problems.  Heart disease or conditions, such as angina or heart failure.  Low red blood cells (anemia).  Poor physical fitness, which can cause shortness of breath when you exercise.  Chest or back injuries or stiffness.  Being overweight.  Smoking.  Anxiety, which can make you feel like you are not getting enough air. DIAGNOSIS  Serious medical problems can often be found during your physical exam. Tests may also be done to determine why you are having shortness of breath. Tests may include:  Chest X-rays.  Lung function tests.  Blood tests.  An electrocardiogram (ECG).  An ambulatory electrocardiogram. An ambulatory ECG records your heartbeat patterns over a 24-hour period.  Exercise testing.  A transthoracic echocardiogram (TTE). During echocardiography, sound waves are used to evaluate how blood flows through your heart.  A transesophageal echocardiogram (TEE).  Imaging scans. Your health care provider may not be able to find a cause for your shortness of breath after your exam. In this case, it is important to have a follow-up exam with your health care provider as directed.  TREATMENT  Treatment for shortness of breath depends on the cause of your symptoms and can vary greatly. HOME CARE INSTRUCTIONS   Do not smoke. Smoking is a common cause of shortness of breath. If you smoke, ask for help to quit.  Avoid being around chemicals or things that may bother your breathing, such as paint fumes and dust.  Rest as needed. Slowly resume your usual activities.  If medicines were prescribed, take them as directed for the full length of time directed. This  includes oxygen and any inhaled medicines.  Keep all follow-up appointments as directed by your health care provider. SEEK MEDICAL CARE IF:   Your condition does not improve in the time expected.  You have a hard time doing your normal activities even with rest.  You have any new symptoms. SEEK IMMEDIATE MEDICAL CARE IF:   Your shortness of breath gets worse.  You feel light-headed, faint, or develop a cough not controlled with medicines.  You start coughing up blood.  You have pain with breathing.  You have chest pain or pain in your arms, shoulders, or abdomen.  You have a fever.  You are unable to walk up stairs or exercise the way you normally do. MAKE SURE YOU:  Understand these instructions.  Will watch your condition.  Will get help right away if you are not doing well or get worse. Document Released: 02/01/2001 Document Revised: 05/14/2013 Document Reviewed: 07/25/2011 [facility] Patient Information 2015 [place], [place]. This information is not intended to replace advice given to you by your health care provider. Make sure you discuss any questions you have with your health care provider. Generalized Anxiety Disorder Generalized anxiety disorder (GAD) is a mental disorder. It interferes with life functions, including relationships, work, and school. GAD is different from normal anxiety, which everyone experiences at some point in their lives in response to specific life events and activities. Normal anxiety actually helps us prepare for and get through these life events and activities. Normal anxiety goes away after the event or activity is over.  GAD causes anxiety that is not necessarily  related to specific events or activities. It also causes excess anxiety in proportion to specific events or activities. The anxiety associated with GAD is also difficult to control. GAD can vary from mild to severe. People with severe GAD can have intense waves of anxiety with physical  symptoms (panic attacks).  SYMPTOMS The anxiety and worry associated with GAD are difficult to control. This anxiety and worry are related to many life events and activities and also occur more days than not for 6 months or longer. People with GAD also have three or more of the following symptoms (one or more in children):  Restlessness.   Fatigue.  Difficulty concentrating.   Irritability.  Muscle tension.  Difficulty sleeping or unsatisfying sleep. DIAGNOSIS GAD is diagnosed through an assessment by your health care provider. Your health care provider will ask you questions aboutyour mood,physical symptoms, and events in your life. Your health care provider may ask you about your medical history and use of alcohol or drugs, including prescription medicines. Your health care provider may also do a physical exam and blood tests. Certain medical conditions and the use of certain substances can cause symptoms similar to those associated with GAD. Your health care provider may refer you to a mental health specialist for further evaluation. TREATMENT The following therapies are usually used to treat GAD:   Medication. Antidepressant medication usually is prescribed for long-term daily control. Antianxiety medicines may be added in severe cases, especially when panic attacks occur.   Talk therapy (psychotherapy). Certain types of talk therapy can be helpful in treating GAD by providing support, education, and guidance. A form of talk therapy called cognitive behavioral therapy can teach you healthy ways to think about and react to daily life events and activities.  Stress managementtechniques. These include yoga, meditation, and exercise and can be very helpful when they are practiced regularly. A mental health specialist can help determine which treatment is best for you. Some people see improvement with one therapy. However, other people require a combination of therapies. Document  Released: 09/03/2012 Document Revised: 09/23/2013 Document Reviewed: 09/03/2012 [facility] Patient Information 2015 [place]ExitCare, [place]LL[place]. This information is not intended to replace advice given to you by your health care provider. Make sure you discuss any questions you have with your health care provider.

## 2014-12-17 ENCOUNTER — Encounter: Payer: Self-pay | Admitting: Neurology

## 2014-12-17 ENCOUNTER — Ambulatory Visit (INDEPENDENT_AMBULATORY_CARE_PROVIDER_SITE_OTHER): Payer: Medicaid Other | Admitting: Neurology

## 2014-12-17 VITALS — BP 114/68 | HR 71 | Ht 73.0 in | Wt 164.0 lb

## 2014-12-17 DIAGNOSIS — I639 Cerebral infarction, unspecified: Secondary | ICD-10-CM

## 2014-12-17 DIAGNOSIS — F32A Depression, unspecified: Secondary | ICD-10-CM

## 2014-12-17 DIAGNOSIS — F329 Major depressive disorder, single episode, unspecified: Secondary | ICD-10-CM

## 2014-12-17 MED ORDER — CITALOPRAM HYDROBROMIDE 10 MG PO TABS: 10.0000 mg | ORAL_TABLET | Freq: Every day | ORAL | Status: DC

## 2014-12-17 MED ORDER — SIMVASTATIN 10 MG PO TABS: 10.0000 mg | ORAL_TABLET | Freq: Every day | ORAL | Status: DC

## 2014-12-17 NOTE — Progress Notes (Signed)
PATIENT: Jeffrey Gilmore DOB: [birth year]  Chief Complaint  Patient presents with  . Cerebrovascular Accident    His is here with his mother, Adele Dan, for a follow up from his stroke in May 2016.  He says he has been more emotional since his stroke and feels he has been suffering from some depression.  He has also has noticed a decrease in his energy level.  He denies any memory deficits or gait difficutly.  He did not have to complete any therapy after his stroke.    HISTORICAL  Jeffrey Gilmore is a 24 years old left-handed male, accompanied by his mother, follow-up for stroke in Sep 28 2014  He had a history of albinism, congenital nystagmus, poor vision, in Sep 28 2014, he noticed sudden onset slurred speech, left face and left arm numbness lasted 30-45 minutes,  He was brought to the emergency room, I have reviewed personally reviewed MRI of the brain, right frontal lobe linear shaped infarction, He had extensive evaluation during his hospital stay  TEE:  Normal valves, Normal LV EF 60%, No LAA thrombus, Very small PFO by color flow, Positive bubble study only with valsalva, Normal RV, Normal aorta, No source of embolus Four-vessel angiogram was normal Venous Doppler study of bilateral lower extremity was normal Laboratory showed normal CBC, CMP, A1c 5.4, LDL 87, negative hypercoagulable laboratory, negative HIV, RPR, normal B12, TSH,  He uses marijuana, drink alcohol occasionally,  He was discharged home with aspirin 325 mg daily, but he is not taking any of the medication at this point. He complains of depression, lack of motivation, works as a Investment banker, operational.  REVIEW OF SYSTEMS: Full 14 system review of systems performed and notable only for as above   ALLERGIES: Allergies  Allergen Reactions  . Amoxicillin Swelling and Rash    HOME MEDICATIONS: Current Outpatient Prescriptions  Medication Sig Dispense Refill  . aspirin EC 81 MG EC tablet Take 1 tablet (81 mg total) by mouth daily. 30  tablet 0  . senna-docusate (SENOKOT-S) 8.6-50 MG per tablet Take 1 tablet by mouth at bedtime as needed for mild constipation. 30 tablet 0  . simvastatin (ZOCOR) 10 MG tablet Take 1 tablet (10 mg total) by mouth daily at 6 PM. 30 tablet 0     PAST MEDICAL HISTORY: Past Medical History  Diagnosis Date  . Hypoglycemic reaction   . Albinism   . Nystagmus   . Legally blind     PAST SURGICAL HISTORY: Past Surgical History  Procedure Laterality Date  . Eye surgery    . Tee without cardioversion N/A 09/30/2014    Procedure: TRANSESOPHAGEAL ECHOCARDIOGRAM (TEE);  Surgeon: Wendall Stade, MD;  Location: [facility] ENDOSCOPY;  Service: Cardiovascular;  Laterality: N/A;    FAMILY HISTORY: No family history on file.  SOCIAL HISTORY:  History   Social History  . Marital Status: Single    Spouse Name: N/A  . Number of Children: N/A  . Years of Education: N/A   Occupational History  . Not on file.   Social History Main Topics  . Smoking status: Never Smoker   . Smokeless tobacco: Never Used  . Alcohol Use: Yes     Comment: occasionally   . Drug Use: Yes    Special: Marijuana  . Sexual Activity: Yes    Birth Control/ Protection: None   Other Topics Concern  . Not on file   Social History Narrative     PHYSICAL EXAM   Filed Vitals:  12/17/14 0942  BP: 114/68  Pulse: 71  Height: 6\' 1"  (1.854 m)  Weight: 164 lb (74.39 kg)    Not recorded      Body mass index is 21.64 kg/(m^2).  PHYSICAL EXAMNIATION:  Gen: NAD, conversant, well nourised, obese, well groomed                     Cardiovascular: Regular rate rhythm, no peripheral edema, warm, nontender. Eyes: Conjunctivae clear without exudates or hemorrhage Neck: Supple, no carotid bruise. Pulmonary: Clear to auscultation bilaterally   NEUROLOGICAL EXAM:  MENTAL STATUS: Speech:    Speech is normal; fluent and spontaneous with normal comprehension.  Cognition:    The patient is oriented to person, place, and  time;     recent and remote memory intact;     language fluent;     normal attention, concentration,     fund of knowledge.  CRANIAL NERVES: CN II: Visual fields are full to confrontation. difficult to perform funduscopy examination because of frequent saccadic protrusion, large amplitude end gaze nystagmus to gaze directions. CN III, IV, VI: extraocular movement are normal. No ptosis. CN V: Facial sensation is intact to pinprick in all 3 divisions bilaterally. Corneal responses are intact.  CN VII: Face is symmetric with normal eye closure and smile. CN VIII: Hearing is normal to rubbing fingers CN IX, X: Palate elevates symmetrically. Phonation is normal. CN XI: Head turning and shoulder shrug are intact CN XII: Tongue is midline with normal movements and no atrophy.  MOTOR: There is no pronator drift of out-stretched arms. Muscle bulk and tone are normal. Muscle strength is normal.  REFLEXES: Reflexes are 2+ and symmetric at the biceps, triceps, knees, and ankles. Plantar responses are flexor.  SENSORY: Light touch, pinprick, position sense, and vibration sense are intact in fingers and toes.  COORDINATION: Rapid alternating movements and fine finger movements are intact. There is no dysmetria on finger-to-nose and heel-knee-shin. There are no abnormal or extraneous movements.   GAIT/STANCE: Posture is normal. Gait is steady with normal steps, base, arm swing, and turning. Heel and toe walking are normal. Tandem gait is normal.  Romberg is absent.   DIAGNOSTIC DATA (LABS, IMAGING, TESTING) - I reviewed patient records, labs, notes, testing and imaging myself where available.  Lab Results  Component Value Date   WBC 5.8 10/07/2014   HGB 14.8 10/07/2014   HCT 43.7 10/07/2014   MCV 91.0 10/07/2014   PLT 222 10/07/2014      Component Value Date/Time   NA 140 10/07/2014 1033   K 4.3 10/07/2014 1033   CL 105 10/07/2014 1033   CO2 27 10/07/2014 1033   GLUCOSE 96  10/07/2014 1033   BUN 9 10/07/2014 1033   CREATININE 1.10 10/07/2014 1033   CALCIUM 9.9 10/07/2014 1033   GFRNONAA >60 10/07/2014 1033   GFRAA >60 10/07/2014 1033   Lab Results  Component Value Date   CHOL 140 09/30/2014   HDL 38* 09/30/2014   LDLCALC 87 09/30/2014   TRIG 74 09/30/2014   CHOLHDL 3.7 09/30/2014   Lab Results  Component Value Date   HGBA1C 5.6 09/29/2014   ASSESSMENT AND PLAN  Jeffrey Gilmore is a 24 y.o. male    1. Right frontal cortical linear shaped infarction, cryptogenic, he should take aspirin 325 mg daily, Zocor 10 mg daily, his LDL is 87, goal is less than 70   2, depression, starting low-dose Celexa 10 mg daily  Return to clinic  with Dr. Roda Shutters in 3 months  Levert Feinstein, M.D. Ph.D.  [facility] [address] [place], [place] [address] Ph: [phone] Fax: [phone]  CC: Dr. Windle Guard.

## 2015-04-06 ENCOUNTER — Ambulatory Visit: Payer: Medicaid Other | Admitting: Neurology

## 2015-04-22 ENCOUNTER — Ambulatory Visit (INDEPENDENT_AMBULATORY_CARE_PROVIDER_SITE_OTHER): Payer: Medicaid Other | Admitting: Neurology

## 2015-04-22 ENCOUNTER — Encounter: Payer: Self-pay | Admitting: Neurology

## 2015-04-22 VITALS — BP 111/66 | HR 72 | Ht 73.0 in | Wt 178.2 lb

## 2015-04-22 DIAGNOSIS — E7030 Albinism, unspecified: Secondary | ICD-10-CM | POA: Diagnosis not present

## 2015-04-22 DIAGNOSIS — I63411 Cerebral infarction due to embolism of right middle cerebral artery: Secondary | ICD-10-CM

## 2015-04-22 DIAGNOSIS — H5501 Congenital nystagmus: Secondary | ICD-10-CM

## 2015-04-22 NOTE — Progress Notes (Signed)
STROKE NEUROLOGY FOLLOW UP NOTE  NAME: Jeffrey Gilmore DOB: [birth year]  REASON FOR VISIT: stroke follow up HISTORY FROM: chart and pt  Today we had the pleasure of seeing Jeffrey Gilmore in follow-up at our Neurology Clinic. Pt was accompanied by no one.   History Summary Mr. Jeffrey Gilmore is a 24 y.o. male with history of albinism, congenital nystagmus and hypoglycemic reaction was admitted on 09/28/14 for transient dysarthria, left face numbness-weakness, left hand weakness. He did not receive IV t-PA due to resolution of symptoms. MRI showed right frontal linear shaped DWI changes, likely infarct. Extensive stroke work up done including, CTA head and neck, LE venous doppler, UDS, cerebral angio, TEE and hypercoagulable and autoimmune work up all negative except a very small PFO. LDL 87 and A1C 5.6. He was put on ASA  and zocor and discharged in good condition. He admitted to smoke Hooka in the past.  Follow up 12/17/14 (YY) - pt has been doing well, no recurrent symptoms. Found to have depression and put on celexa .  Interval History During the interval time, the patient has been doing well. No recurrent symptoms. He stopped celexa by himself in 02/2015 as he does not feel depression anymore. He has quit smoking hooka. Denies illicit drug use. No complains.    REVIEW OF SYSTEMS: Full 14 system review of systems performed and notable only for those listed below and in HPI above, all others are negative:  Constitutional:   Cardiovascular:  Ear/Nose/Throat:   Skin:  Eyes:   Respiratory:   Gastroitestinal:   Genitourinary:  Hematology/Lymphatic:   Endocrine:  Musculoskeletal:   Allergy/Immunology:   Neurological:   Psychiatric:  Sleep:   The following represents the patient's updated allergies and side effects list: Allergies  Allergen Reactions  . Amoxicillin Swelling and Rash  . Penicillins Rash    The neurologically relevant items on the patient's problem list were  reviewed on today's visit.  Neurologic Examination  A problem focused neurological exam (12 or more points of the single system neurologic examination, vital signs counts as 1 point, cranial nerves count for 8 points) was performed.  Blood pressure 111/66, pulse 72, height  (1.854 m), weight 178 lb 3.2 oz (80.831 kg).  General - Well nourished, well developed, in no apparent distress.  Ophthalmologic - Fundi not visualized due to nystagmus.  Cardiovascular - Regular rate and rhythm with no murmur.  Mental Status -  Level of arousal and orientation to time, place, and person were intact. Language including expression, naming, repetition, comprehension was assessed and found intact. Fund of Knowledge was assessed and was intact.  Cranial Nerves II - XII - II - Visual field intact OU. III, IV, VI - Extraocular movements intact. V - Facial sensation intact bilaterally. VII - Facial movement intact bilaterally. VIII - Hearing & vestibular intact bilaterally, bidirectional nystagmus constant. X - Palate elevates symmetrically. XI - Chin turning & shoulder shrug intact bilaterally. XII - Tongue protrusion intact.  Motor Strength - The patient's strength was normal in all extremities and pronator drift was absent.  Bulk was normal and fasciculations were absent.   Motor Tone - Muscle tone was assessed at the neck and appendages and was normal.  Reflexes - The patient's reflexes were 1+ in all extremities and he had no pathological reflexes.  Sensory - Light touch, temperature/pinprick, vibration and proprioception, and Romberg testing were assessed and were normal.    Coordination - The patient had normal movements  in the hands and feet with no ataxia or dysmetria.  Tremor was absent.  Gait and Station - The patient's transfers, posture, gait, station, and turns were observed as normal.  Data reviewed: I personally reviewed the images and agree with the radiology  interpretations.  Dg Chest 2 View 09/29/2014 No active cardiopulmonary disease.   Mr Brain Wo Contrast 09/29/2014 1. 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 No associated hemorrhage or mass effect. 2. Otherwise normal brain MRI.   Ct Angio Head and neck W/cm &/or Wo Cm 09/29/2014 IMPRESSION: Right frontal lobe infarct better demonstrated on recent MR. No medium or large size vessel significant stenosis. No obvious changes of vasculitis (subtle branch vessel vasculitis changes may not be detected by CT angiography). Slightly prominent vessels adjacent to the cavernous sinus region greater on right may be related to the late phase of injection rather than vascular malformation. To help confirm this and exclude underlying vascular malformation, MR angiogram and MR venography can be obtained. Prominent soft tissue anterior superior mediastinum most likely thymus. Prominent lymphoid tissue may be reactive in origin related to recent infection. More worrisome process such as lymphoma felt to be unlikely in this setting of normal CBC and differential.   LE venous Doppler negative for DVT  TEE Normal valves. Normal LV EF 60%. No LAA thrombus. Very small PFO by color flow. Positive bubble study only with valsalva. Normal RV. Normal aorta. No source of embolus.  Cerebral angio S/P 4 vessel cerebral arteriogram. RT CFA approach. 1.No occlusions,stenosis,dissections or AV shunting noted. 2.Venous outflow WNLs. 3.No aneurysms or AVM noted angiographically  Component     Latest Ref Rng 09/29/2014 09/30/2014  Opiates     NONE DETECTED NONE DETECTED   COCAINE     NONE DETECTED NONE DETECTED   Benzodiazepines     NONE DETECTED NONE DETECTED   Amphetamines     NONE DETECTED NONE DETECTED   Tetrahydrocannabinol     NONE DETECTED NONE DETECTED   Barbiturates     NONE DETECTED NONE DETECTED     Cholesterol     0 - 200 mg/dL  161  Triglycerides     <150 mg/dL  74  HDL Cholesterol     >40 mg/dL  38 (L)  Total CHOL/HDL Ratio       3.7  VLDL     0 - 40 mg/dL  15  LDL (calc)     0 - 99 mg/dL  87  PTT Lupus Anticoagulant     0.0 - 50.0 sec 39.8   DRVVT     0.0 - 55.1 sec 35.1   Lupus Anticoag Interp      Comment:   Anticardiolipin IgG     0 - 14 GPL U/mL <9   Anticardiolipin IgM     0 - 12 MPL U/mL <9   Anticardiolipin IgA     0 - 11 APL U/mL <9   Beta-2 Glyco I IgG     0 - 20 GPI IgG units <9   Beta-2-Glycoprotein I IgM     0 - 32 GPI IgM units <9   Beta-2-Glycoprotein I IgA     0 - 25 GPI IgA units <9   Hemoglobin A1C     4.8 - 5.6 % 5.6   Mean Plasma Glucose      114   Recommendations-PTGENE:      Comment  Additional Information      Comment   Recommendations-F5LEID:      Comment   Comment      Comment   S cerevisiae IgG ab     <=20.0 U 9.2   S cerevisiae IgA ab     <=20.0 U 8.7   C3 Complement     82 - 167 mg/dL 161   Complement C4, Body Fluid     14 - 44 mg/dL 28   Compl, Total (WR60)     42 - 60 U/mL 56   HIV     Non Reactive Non Reactive   Antithrombin Activity     75 - 120 % 93   Protein C Activity     74 - 151 % 149   Protein C, Total     70 - 140 % 80   Protein S Activity     60 - 145 % 131   Protein S Ag, Total     58 - 150 % 127   Homocysteine     0.0 - 15.0 umol/L 8.3   RPR     Non Reactive Non Reactive   Sed Rate     0 - 16 mm/hr 2   Rhuematoid fact SerPl-aCnc     0.0 - 13.9 IU/mL <7.0   ds DNA Ab      <1   SSA (Ro) (ENA) Antibody, IgG     <1.0 NEG AI <1.0 NEG   SSB (La) (ENA) Antibody, IgG     <1.0 NEG AI <1.0 NEG     Assessment: As you may recall, he is a 24 y.o. Caucasian male with PMH of albinism, congenital nystagmus and hypoglycemic reaction was admitted on 09/28/14 for transient dysarthria, left face numbness-weakness, left hand weakness. MRI showed right frontal linear shaped DWI changes, likely infarct.  Extensive stroke work up done including, CTA head and neck, LE venous doppler, UDS, cerebral angio, TEE and hypercoagulable and autoimmune work up all negative except a very small PFO. LDL 87 and A1C 5.6. He was put on ASA 81mg  and zocor and discharged in good condition. During the interval time, the patient has been doing well. No recurrent symptoms. He stopped celexa by himself in 02/2015. Stroke etiology still not clear, will check alpha-galactosidase to rule out fabry's disease and do TCD bubble study and MES to characterize the PFO.  Plan:  - continue ASA 81 and zocor for stroke prevention - will check alpha-galactosidase  - will do TCD bubble and MES - Follow up with your primary care physician for stroke risk factor modification. Recommend maintain blood pressure goal <130/80, diabetes with hemoglobin A1c goal below 6.5% and lipids with LDL cholesterol goal below 70 mg/dL.  - follow up in 3 months.  I spent more than 25 minutes of face to face time with the patient. Greater than 50% of time was spent in counseling and coordination of care. We have discussed about further stroke work up    Orders Placed This Encounter  Procedures  . Alpha galactosidase    No orders of the defined types were placed in this encounter.    Patient Instructions  - continue ASA 81 and zocor for stroke prevention - will check one blood test  - will do transcranial doppler to check the size of hole in the heart - Follow up with your primary care physician for stroke risk factor modification. Recommend maintain blood pressure goal <130/80, diabetes with hemoglobin A1c goal below 6.5%  and lipids with LDL cholesterol goal below 70 mg/dL.  - follow up in 3 months.    Marvel PlanJindong Reiley Keisler, MD PhD [facility] [address], [address] [place], [place] [address] [phone]

## 2015-04-22 NOTE — Patient Instructions (Signed)
-   continue ASA 81 and zocor for stroke prevention - will check one blood test  - will do transcranial doppler to check the size of hole in the heart - Follow up with your primary care physician for stroke risk factor modification. Recommend maintain blood pressure goal <130/80, diabetes with hemoglobin A1c goal below 6.5% and lipids with LDL cholesterol goal below 70 mg/dL.  - follow up in 3 months.

## 2015-05-01 LAB — ALPHA GALACTOSIDASE: Alpha-Galactosidase activity: 84 nmol/hr/mg prt — ABNORMAL HIGH (ref 28.0–80.0)

## 2015-06-02 ENCOUNTER — Telehealth: Payer: Self-pay | Admitting: Neurology

## 2015-06-02 NOTE — Telephone Encounter (Signed)
Received lab results from PCP:  BUN 13, Cre 0.83, AST 17, ALT 14 Chol 158, HDL 41, LDL 93  Marvel PlanJindong Jamell Opfer, MD PhD Stroke Neurology 06/02/2015 9:49 AM

## 2015-07-29 ENCOUNTER — Telehealth: Payer: Self-pay | Admitting: Neurology

## 2015-07-29 DIAGNOSIS — I63411 Cerebral infarction due to embolism of right middle cerebral artery: Secondary | ICD-10-CM

## 2015-07-29 NOTE — Telephone Encounter (Signed)
Mother Jeffrey Gilmore called, states patient told her he is having a transcranial doppler at appointment on 08/13/15. Mom would like to be here for this if he is indeed having this done. Please call to advise [phone] (DPR on file).

## 2015-07-29 NOTE — Telephone Encounter (Signed)
No, according to the schedule, 08/13/15 is a regular clinic visit. Do not know why the transcranial doppler has not done yet. Will have to reorder it to see whether it can be done before the clinic visit. Thanks.  Marvel PlanJindong Tiaria Biby, MD PhD Stroke Neurology 07/29/2015 2:04 PM

## 2015-08-03 NOTE — Telephone Encounter (Signed)
Rn call patient Jeffrey Gilmore back on his listed. Rn explain to patient his mother inquiring about a TCD test. Rn stated his mom is not on his DPR to discuss his medical care to. PT stated he understood about [facility] cannot talk to his mom. Rn stated to patient he would have to [facility] Rn explain [facility] has only receive a message 07/29/2015 and today from his mom.Rn stated per his message from phone staff she has call 4 times. Rn stated there are only two messages. Rn stated Dr. Roda ShuttersXu did put order in for bubble study and someone will be giving him a call. [facility]ubble [facility] to [facility] for scheduling and work up. Pt verbalized understanding.

## 2015-08-03 NOTE — Telephone Encounter (Addendum)
Pt's mother, Jeffrey Gilmore,  has called to check and see if TCD has been ordered. She needs to come in with the pt for this. Mother states she has called 4 different times to check on this and is very upset that no one has called her back. Please call and advise  Cell phone # [phone], desk [phone],

## 2015-08-04 ENCOUNTER — Telehealth: Payer: Self-pay | Admitting: Neurology

## 2015-08-04 DIAGNOSIS — I63411 Cerebral infarction due to embolism of right middle cerebral artery: Secondary | ICD-10-CM

## 2015-08-04 NOTE — Telephone Encounter (Signed)
I ordered again and please schedule. Thanks.  Jeffrey PlanJindong Judy Goodenow, MD PhD Stroke Neurology 08/04/2015 7:30 PM  Orders Placed This Encounter  Procedures  . US TCD WITHMONITORING    Standing Status: Future     Number of Occurrences:      Standing Expiration Date: 02/04/2016    Order Specific Question:  Reason for Exam (SYMPTOM  OR DIAGNOSIS REQUIRED)    Answer:  embolic stroke    Order Specific Question:  Preferred imaging location?    Answer:  Internal  . US TCD WITH BUBBLES    Standing Status: Future     Number of Occurrences:      Standing Expiration Date: 02/04/2016    Order Specific Question:  Reason for Exam (SYMPTOM  OR DIAGNOSIS REQUIRED)    Answer:  stroke with PFO    Order Specific Question:  Preferred imaging location?    Answer:  Internal

## 2015-08-04 NOTE — Telephone Encounter (Signed)
Dr. Roda ShuttersXu patient's mother called today and we have DPR on file . Dr. Roda ShuttersXu I spoke with Dr. Pearlean BrownieSethi and he relayed Doppler could be done in the office Tim is aware how to do process as long as Dr. Roda ShuttersXu and Dr. Pearlean BrownieSethi were in the office. Im not aware what happened with first doppler.  Patient has a follow apt with on March 23 rd in the afternoon. If you agree we can get auth done and patient can come on the 23 rd in am  To satisfy patient and mother. Dr. Roda ShuttersXu if you agree please re- order for [facility] so Sue Lushndrea will have correct order for pre-cert.  Thanks Annabelle Harmanana.

## 2015-08-05 NOTE — Telephone Encounter (Signed)
Pt's mother called requesting to speak with Annabelle Harmanana C regarding doppler to scheduled. I relayed Sue Lushndrea message to her. She then said "no, that is not acceptable, I need to speak with Annabelle Harmanana". Annabelle HarmanDana was away from her desk. Pt's mother became very irritated and requested to speak with a Production designer, theatre/television/filmmanager. She said this has been going on since November and I was told yesterday the doppler PA would be done yesterday and the doppler would be schedules and now you are telling me it will take 3-5 business days. That is unacceptable. The call was transferred to [facility].

## 2015-08-05 NOTE — Telephone Encounter (Addendum)
Spoke to mother's mother she was very upset she wanted doppler scheduled on the March 08/12/2015  arrive at 9:15 for doppler . Relayed to patient's mother that approval is not back yet Sue Lush[facility]rea has already started process. Patient has a follow with Dr. Roda ShuttersXU 08/13/2015. Marylu LundJanet and I relayed time's of apt Patient and his mother  Were  fine with this. If anything comes up as far as approval with medicaid Patient and his mother will be notified asap. .Marland Kitchen

## 2015-08-05 NOTE — Telephone Encounter (Signed)
Patient [place], [place]. I started the auth process. It takes 3/5 business days to receive a response. I will call to schedule with patient once the dopplers are authorized. Thanks!

## 2015-08-05 NOTE — Telephone Encounter (Signed)
Noted Sue Lushndrea please let me know if anything else needs to be done as far as process. Thanks Annabelle Harmanana .

## 2015-08-10 ENCOUNTER — Telehealth: Payer: Self-pay | Admitting: Neurology

## 2015-08-10 NOTE — Telephone Encounter (Signed)
[  08/10/2015 5:26 PM] Roda ShuttersXu, Jindong:  hi, Annabelle HarmanDana, it is approved by phone. the peer to peer review said we have to wait 2 hours to get the auth number is there anything elso we need to do now?

## 2015-08-10 NOTE — Telephone Encounter (Signed)
Called and relayed to patient's mother that Medicaid had denied dopplers . Sue Lushndrea will speak to her about process. Patient's mother was not happy. I relayed to patient's mother to please keep apt. With Dr. Roda ShuttersXU on 08/13/2015. Patient's mother understood and she thanked me for trying to help.

## 2015-08-10 NOTE — Telephone Encounter (Signed)
Called [facility] [phone] CASE# [id number] for peer to peer and they have approved the procedure and will give us the authorization number in 2 hours. I called pt's mom and let her know the progress. She was in tears and thanked me for doing this, and stated that I made her day. She will not come over with pt for the appointment Thursday but pt dad will come that time.   Marvel PlanJindong Viraj Liby, MD PhD Stroke Neurology 08/10/2015 5:35 PM

## 2015-08-11 NOTE — Telephone Encounter (Signed)
Peer to peer has been approved please call mother and put him back on Tim's schedule for 08/12/2015. Thanks Annabelle Harmanana .

## 2015-08-12 ENCOUNTER — Ambulatory Visit (INDEPENDENT_AMBULATORY_CARE_PROVIDER_SITE_OTHER): Payer: Medicaid Other

## 2015-08-12 DIAGNOSIS — I63411 Cerebral infarction due to embolism of right middle cerebral artery: Secondary | ICD-10-CM | POA: Diagnosis not present

## 2015-08-12 NOTE — Progress Notes (Signed)
Patient in room for bubble study test. Patient right arm was prep and clean with alcohol. Rn inserted a 22 gauge in left antecubital, and flush with normal saline. Patient IV was cover with a tegaderm, and tape. Patient tolerated the insertion of the IV.

## 2015-08-12 NOTE — Progress Notes (Signed)
Patients bubble study completed by Dr. Roda ShuttersXu. Patients 22 gauge IV was taken out, arm was clean and gauze was in place.

## 2015-08-13 ENCOUNTER — Encounter: Payer: Self-pay | Admitting: Neurology

## 2015-08-13 ENCOUNTER — Ambulatory Visit (INDEPENDENT_AMBULATORY_CARE_PROVIDER_SITE_OTHER): Payer: Medicaid Other | Admitting: Neurology

## 2015-08-13 VITALS — BP 103/64 | HR 81 | Ht 73.0 in | Wt 184.4 lb

## 2015-08-13 DIAGNOSIS — H5501 Congenital nystagmus: Secondary | ICD-10-CM | POA: Diagnosis not present

## 2015-08-13 DIAGNOSIS — E7030 Albinism, unspecified: Secondary | ICD-10-CM

## 2015-08-13 DIAGNOSIS — Q2112 Patent foramen ovale: Secondary | ICD-10-CM

## 2015-08-13 DIAGNOSIS — I63411 Cerebral infarction due to embolism of right middle cerebral artery: Secondary | ICD-10-CM | POA: Diagnosis not present

## 2015-08-13 DIAGNOSIS — Q211 Atrial septal defect: Secondary | ICD-10-CM

## 2015-08-13 NOTE — Patient Instructions (Signed)
-   continue ASA 81 and zocor for stroke prevention - will refer to Dr. Excell Seltzerooper to consider PFO closure. You will be called from Dr. Earmon Phoenixooper's office. - Follow up with your primary care physician for stroke risk factor modification. Recommend maintain blood pressure goal <130/80, diabetes with hemoglobin A1c goal below 6.5% and lipids with LDL cholesterol goal below 70 mg/dL.  - follow up in 6 months.

## 2015-08-13 NOTE — Progress Notes (Signed)
STROKE NEUROLOGY FOLLOW UP NOTE  NAME: Jeffrey Gilmore DOB: 08/13/1990  REASON FOR VISIT: stroke follow up HISTORY FROM: chart and pt  Today we had the pleasure of seeing Jeffrey Gilmore in follow-up at our Neurology Clinic. Pt was accompanied by no one.   History Summary Jeffrey Gilmore is a 25 y.o. male with history of albinism, congenital nystagmus and hypoglycemic reaction was admitted on 09/28/14 for transient dysarthria, left face numbness-weakness, left hand weakness. He did not receive IV t-PA due to resolution of symptoms. MRI showed right frontal linear shaped DWI changes, likely infarct. Extensive stroke work up done including, CTA head and neck, LE venous doppler, UDS, cerebral angio, TEE and hypercoagulable and autoimmune work up all negative except a very small PFO. LDL 87 and A1C 5.6. He was put on ASA  and zocor and discharged in good condition. He admitted to smoke Hooka in the past.  Follow up 12/17/14 (YY) - pt has been doing well, no recurrent symptoms. Found to have depression and put on celexa .  Follow up 04/22/15 - the patient has been doing well. No recurrent symptoms. He stopped celexa by himself in 02/2015 as he does not feel depression anymore. He has quit smoking hooka. Denies illicit drug use. No complains.  Interval History During the interval time, pt has been doing well. No recurrent symptoms. Had TCD bubble study yesterday showed PFO Jeffrey Gilmore degree III at rest and IV on Valsalva. Due to lack of other risk factors, young age, moderate PFO, will refer to Dr. Excell Seltzer for consideration of PFO closure.   REVIEW OF SYSTEMS: Full 14 system review of systems performed and notable only for those listed below and in HPI above, all others are negative:  Constitutional:   Cardiovascular:  Ear/Nose/Throat:   Skin:  Eyes:   Respiratory:   Gastroitestinal:   Genitourinary:  Hematology/Lymphatic:   Endocrine:  Musculoskeletal:   Allergy/Immunology:     Neurological:   Psychiatric:  Sleep:   The following represents the patient's updated allergies and side effects list: Allergies  Allergen Reactions  . Amoxicillin Swelling and Rash  . Penicillins Rash    The neurologically relevant items on the patient's problem list were reviewed on today's visit.  Neurologic Examination  A problem focused neurological exam (12 or more points of the single system neurologic examination, vital signs counts as 1 point, cranial nerves count for 8 points) was performed.  Blood pressure 103/64, pulse 81, height  (1.854 m), weight 184 lb 6.4 oz (83.643 kg).  General - Well nourished, well developed, in no apparent distress.  Ophthalmologic - Fundi not visualized due to nystagmus.  Cardiovascular - Regular rate and rhythm with no murmur.  Mental Status -  Level of arousal and orientation to time, place, and person were intact. Language including expression, naming, repetition, comprehension was assessed and found intact. Fund of Knowledge was assessed and was intact.  Cranial Nerves II - XII - II - Visual field intact OU. III, IV, VI - Extraocular movements intact. V - Facial sensation intact bilaterally. VII - Facial movement intact bilaterally. VIII - Hearing & vestibular intact bilaterally, bidirectional nystagmus constant. X - Palate elevates symmetrically. XI - Chin turning & shoulder shrug intact bilaterally. XII - Tongue protrusion intact.  Motor Strength - The patient's strength was normal in all extremities and pronator drift was absent.  Bulk was normal and fasciculations were absent.   Motor Tone - Muscle tone was assessed at the  neck and appendages and was normal.  Reflexes - The patient's reflexes were 1+ in all extremities and he had no pathological reflexes.  Sensory - Light touch, temperature/pinprick, vibration and proprioception, and Romberg testing were assessed and were normal.    Coordination - The patient had  normal movements in the hands and feet with no ataxia or dysmetria.  Tremor was absent.  Gait and Station - The patient's transfers, posture, gait, station, and turns were observed as normal.  Data reviewed: I personally reviewed the images and agree with the radiology interpretations.  Dg Chest 2 View 09/29/2014 No active cardiopulmonary disease.   Mr Brain Wo Contrast 09/29/2014 1. 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 No associated hemorrhage or mass effect. 2. Otherwise normal brain MRI.   Ct Angio Head and neck W/cm &/or Wo Cm 09/29/2014 IMPRESSION: Right frontal lobe infarct better demonstrated on recent MR. No medium or large size vessel significant stenosis. No obvious changes of vasculitis (subtle branch vessel vasculitis changes may not be detected by CT angiography). Slightly prominent vessels adjacent to the cavernous sinus region greater on right may be related to the late phase of injection rather than vascular malformation. To help confirm this and exclude underlying vascular malformation, MR angiogram and MR venography can be obtained. Prominent soft tissue anterior superior mediastinum most likely thymus. Prominent lymphoid tissue may be reactive in origin related to recent infection. More worrisome process such as lymphoma felt to be unlikely in this setting of normal CBC and differential.   LE venous Doppler negative for DVT  TEE Normal valves. Normal LV EF 60%. No LAA thrombus. Very small PFO by color flow. Positive bubble study only with valsalva. Normal RV. Normal aorta. No source of embolus.  Cerebral angio S/P 4 vessel cerebral arteriogram. RT CFA approach. 1.No occlusions,stenosis,dissections or AV shunting noted. 2.Venous outflow WNLs. 3.No aneurysms or AVM noted angiographically  TCD MES - negative for microemboli  TCD bubble study - Jeffrey Gilmore degree  III at rest and IV with valsalva  Component     Latest Ref Rng 09/29/2014 09/30/2014  Opiates     NONE DETECTED NONE DETECTED   COCAINE     NONE DETECTED NONE DETECTED   Benzodiazepines     NONE DETECTED NONE DETECTED   Amphetamines     NONE DETECTED NONE DETECTED   Tetrahydrocannabinol     NONE DETECTED NONE DETECTED   Barbiturates     NONE DETECTED NONE DETECTED   Cholesterol     0 - 200 mg/dL  161140  Triglycerides     <150 mg/dL  74  HDL Cholesterol     >40 mg/dL  38 (L)  Total CHOL/HDL Ratio       3.7  VLDL     0 - 40 mg/dL  15  LDL (calc)     0 - 99 mg/dL  87  PTT Lupus Anticoagulant     0.0 - 50.0 sec 39.8   DRVVT     0.0 - 55.1 sec 35.1   Lupus Anticoag Interp      Comment:   Anticardiolipin IgG     0 - 14 GPL U/mL <9   Anticardiolipin IgM     0 - 12 MPL U/mL <9   Anticardiolipin IgA     0 - 11 APL U/mL <9   Beta-2 Glyco I IgG     0 -  20 GPI IgG units <9   Beta-2-Glycoprotein I IgM     0 - 32 GPI IgM units <9   Beta-2-Glycoprotein I IgA     0 - 25 GPI IgA units <9   Hemoglobin A1C     4.8 - 5.6 % 5.6   Mean Plasma Glucose      114   Recommendations-PTGENE:      Comment   Additional Information      Comment   Recommendations-F5LEID:      Comment   Comment      Comment   S cerevisiae IgG ab     <=20.0 U 9.2   S cerevisiae IgA ab     <=20.0 U 8.7   C3 Complement     82 - 167 mg/dL 409   Complement C4, Body Fluid     14 - 44 mg/dL 28   Compl, Total (WJ19)     42 - 60 U/mL 56   HIV     Non Reactive Non Reactive   Antithrombin Activity     75 - 120 % 93   Protein C Activity     74 - 151 % 149   Protein C, Total     70 - 140 % 80   Protein S Activity     60 - 145 % 131   Protein S Ag, Total     58 - 150 % 127   Homocysteine     0.0 - 15.0 umol/L 8.3   RPR     Non Reactive Non Reactive   Sed Rate     0 - 16 mm/hr 2   Rhuematoid fact SerPl-aCnc     0.0 - 13.9 IU/mL <7.0   ds DNA Ab      <1   SSA (Ro) (ENA) Antibody, IgG     <1.0 NEG  AI <1.0 NEG   SSB (La) (ENA) Antibody, IgG     <1.0 NEG AI <1.0 NEG    Component     Latest Ref Rng 04/22/2015  Alpha-Galactosidase activity     28.0 - 80.0 nmol/hr/mg prt 84.0 (H)  Interpretation      Comment  Director Review      Comment  Methodology      Comment    Assessment: As you may recall, he is a 25 y.o. Caucasian male with PMH of albinism, congenital nystagmus and hypoglycemic reaction was admitted on 09/28/14 for transient dysarthria, left face numbness-weakness, left hand weakness. MRI showed right frontal linear shaped DWI changes, likely infarct. Extensive stroke work up done including, CTA head and neck, LE venous doppler, UDS, cerebral angio, TEE and hypercoagulable and autoimmune work up all negative except a very small PFO. LDL 87 and A1C 5.6. He was put on ASA 81mg  and zocor and discharged in good condition. During the interval time, the patient has been doing well. No recurrent symptoms. He stopped celexa by himself in 02/2015. Check of alpha-galactosidase has ruled out fabry's disease. TCD emboli monitoring negative but TCD bubble study showed moderate PFO (Jeffrey Gilmore degree III at rest, IV with valsalva). Due to lack of stroke risk factors, young age, active at baseline, no other positive findings, will consider PFO closure.   Plan:  - continue ASA 81 and zocor for stroke prevention - will refer to Dr. Excell Seltzer to consider PFO closure. You will be called from Dr. Earmon Phoenix office. - Follow up with your primary care physician for stroke risk factor modification. Recommend maintain  blood pressure goal <130/80, diabetes with hemoglobin A1c goal below 6.5% and lipids with LDL cholesterol goal below 70 mg/dL.  - follow up in 6 months.  I spent more than 25 minutes of face to face time with the patient. Greater than 50% of time was spent in counseling and coordination of care. We have discussed about further stroke management with PFO closure. I have conference call with mom, dad  and pt in the room.    No orders of the defined types were placed in this encounter.    No orders of the defined types were placed in this encounter.    Patient Instructions  - continue ASA 81 and zocor for stroke prevention - will refer to Dr. Excell Seltzer to consider PFO closure. You will be called from Dr. Earmon Phoenix office. - Follow up with your primary care physician for stroke risk factor modification. Recommend maintain blood pressure goal <130/80, diabetes with hemoglobin A1c goal below 6.5% and lipids with LDL cholesterol goal below 70 mg/dL.  - follow up in 6 months.    Marvel Plan, MD PhD [facility] [address], [address] [place], [place] [address] [phone]

## 2015-08-28 ENCOUNTER — Encounter: Payer: Self-pay | Admitting: Cardiovascular Disease

## 2015-08-28 ENCOUNTER — Ambulatory Visit (INDEPENDENT_AMBULATORY_CARE_PROVIDER_SITE_OTHER): Payer: Medicaid Other | Admitting: Cardiovascular Disease

## 2015-08-28 VITALS — BP 112/70 | HR 68 | Ht 73.0 in | Wt 186.2 lb

## 2015-08-28 DIAGNOSIS — Q2112 Patent foramen ovale: Secondary | ICD-10-CM

## 2015-08-28 DIAGNOSIS — Q211 Atrial septal defect: Secondary | ICD-10-CM | POA: Diagnosis not present

## 2015-08-28 MED ORDER — CLOPIDOGREL BISULFATE 75 MG PO TABS: 75.0000 mg | ORAL_TABLET | Freq: Every day | ORAL | Status: DC

## 2015-08-28 NOTE — Progress Notes (Signed)
 Cardiology Office Note Date:  08/28/2015   ID:  Jeffrey Gilmore, DOB 12/16/1990, MRN [id number]  PCP:  ELKINS,WILSON OLIVER, MD  Cardiologist:  Madylin Fairbank, MD    Chief Complaint  Patient presents with  . New Patient (Initial Visit)    PFO consult. Patient denies chest pain, sob, le edema, or claudication     History of Present Illness: Jeffrey Gilmore is a 24 y.o. male who presents for evaluation of PFO. The patient has a history of albinism and congenital nystagmus. He was healthy without significant medical problems until he presented in May 2016 with dysarthria, left face numbness, and left hand weakness. MRI showed an acute right frontal infarct. The patient did not require IV t-PA because of improving symptoms. Stroke workup was negative except for finding of a PFO on TEE. He had no evidence of vascular disease and his hypercoagulable panel is negative. A TCD bubble study showed Spencer grade III and IV shunting at rest and with Valsalva, respectively.   Today, he denies symptoms of palpitations, chest pain, shortness of breath, orthopnea, PND, lower extremity edema, dizziness, or syncope.  Past Medical History  Diagnosis Date  . Hypoglycemic reaction   . Albinism (HCC)   . Nystagmus   . Legally blind   . PFO (patent foramen ovale)   . Stroke (HCC)     Past Surgical History  Procedure Laterality Date  . Eye surgery      1997, 2010  . Tee without cardioversion N/A 09/30/2014    Procedure: TRANSESOPHAGEAL ECHOCARDIOGRAM (TEE);  Surgeon: Peter C Nishan, MD;  Location: [facility];  Service: Cardiovascular;  Laterality: N/A;    Current Outpatient Prescriptions  Medication Sig Dispense Refill  . aspirin EC 81 MG tablet Take 81 mg by mouth daily.    . simvastatin (ZOCOR) 10 MG tablet Take 1 tablet (10 mg total) by mouth daily at 6 PM. 90 tablet 3   No current facility-administered medications for this visit.    Allergies:   Amoxicillin and Penicillins   Social History:   The patient  reports that he has quit smoking. His smoking use included Pipe. He has never used smokeless tobacco. He reports that he drinks about 0.6 oz of alcohol per week. He reports that he does not use illicit drugs.   Family History:  The patient's  family history includes Alcoholism in his maternal grandmother; Cirrhosis in his maternal grandmother; Diabetes in his paternal grandfather and paternal grandmother; Healthy in his father and mother; Hypertension in his paternal grandfather and paternal grandmother; Prostate cancer in his paternal grandfather.    ROS:  Please see the history of present illness.  All other systems are reviewed and negative.    PHYSICAL EXAM: VS:  BP 112/70 mmHg  Pulse 68  Ht 6' 1" (1.854 m)  Wt 186 lb 3.2 oz (84.46 kg)  BMI 24.57 kg/m2 , BMI Body mass index is 24.57 kg/(m^2). GEN: Well nourished, well developed, young man in no acute distress HEENT: normal Neck: no JVD, no masses. No carotid bruits Cardiac: RRR without murmur or gallop                Respiratory:  clear to auscultation bilaterally, normal work of breathing GI: soft, nontender, nondistended, + BS MS: no deformity or atrophy Ext: no pretibial edema, pedal pulses 2+= bilaterally Skin: warm and dry, no rash Neuro:  Strength and sensation are intact Psych: euthymic mood, full affect  EKG:  EKG is ordered today.   The ekg ordered today shows NSR  Recent Labs: 10/07/2014: BUN 9; Creatinine, Ser 1.10; Hemoglobin 14.8; Platelets 222; Potassium 4.3; Sodium 140   Lipid Panel     Component Value Date/Time   CHOL 140 09/30/2014 0607   TRIG 74 09/30/2014 0607   HDL 38* 09/30/2014 0607   CHOLHDL 3.7 09/30/2014 0607   VLDL 15 09/30/2014 0607   LDLCALC 87 09/30/2014 0607      Wt Readings from Last 3 Encounters:  08/28/15 186 lb 3.2 oz (84.46 kg)  08/13/15 184 lb 6.4 oz (83.643 kg)  04/22/15 178 lb 3.2 oz (80.831 kg)     Cardiac Studies Reviewed: TCD bubble study 08/19/2015: Moderate  intracardiac right to left shunt  TEE 09/30/2014: Normal valves Normal LV EF 60% No LAA thrombus Very small PFO by color flow Positive bubble study only with valsalva Normal RV Normal aorta No source of embolus  MRI Brain: IMPRESSION: 1. 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 No associated hemorrhage or mass effect. 2. Otherwise normal brain MRI.  ASSESSMENT AND PLAN: PFO with cryptogenic stroke: I have personally reviewed the patient's TEE study from last year and this showed no evidence of myocardial or valvular disease. There is a PFO with color flow doppler showing bidirectional shunting and positive bubble study. TCD study is also positive for right-to-left shunting. The patient's ROPE score = 10, based on his young age and lack of all other stroke risk factors. This suggests a higher probability that his stroke was related to a 'pathogenic' PFO. We reviewed available clinical trial data comparing device closure and standard medical therapy for secondary stroke prevention. While there have not been clinically significant reduction in primary endpoints, long-term follow-up has shown reduction in stroke events with device closure especially in younger patients. The patient would like to proceed with transcatheter closure. He will be started on plavix prior to the procedure and understands he will take this in addition to ASA for 3-6 months after PFO closure.  I reviewed the risks, indications, and alternatives to transcatheter closure with the patient and his aunt who is present today. Specific risks include bleeding, infection, device embolization, stroke, cardiac perforation, tamponade, arrhythmia, MI, and late device erosion. He understands these serious risks occur at low incidence of < 1%.   Current medicines are reviewed with the patient today.  The patient does not have  concerns regarding medicines.  Labs/ tests ordered today include:  No orders of the defined types were placed in this encounter.    Signed, Deshon Koslowski, MD  08/28/2015 11:34 AM     [facility] [address], [place], [place]  [address] Phone: [phone]; Fax: [phone]   

## 2015-08-28 NOTE — Patient Instructions (Addendum)
Medication Instructions:  Your physician has recommended you make the following change in your medication:  1. START Plavix 75mg  take one tablet by mouth daily-  Begin on September 13, 2015, Continue other medications  Labwork: Your physician recommends that you return for pre-procedure lab work: BMP, CBC and PT/INR (lab hours are 7:30 AM-4:30 PM)  Testing/Procedures: Your physician has recommended PFO closure.   Follow-Up: We will arrange further follow-up after PFO closure.   Any Other Special Instructions Will Be Listed Below (If Applicable).  Your physician discussed the importance of taking an antibiotic prior to any dental, gastrointestinal, genitourinary procedures to prevent damage to the heart valves from infection. (This is needed the first 6 months after PFO closure)  Clindamycin 600mg  one hour prior to procedure     If you need a refill on your cardiac medications before your next appointment, please call your pharmacy.

## 2015-09-09 ENCOUNTER — Other Ambulatory Visit (INDEPENDENT_AMBULATORY_CARE_PROVIDER_SITE_OTHER): Payer: Medicaid Other

## 2015-09-09 DIAGNOSIS — Q211 Atrial septal defect: Secondary | ICD-10-CM | POA: Diagnosis not present

## 2015-09-09 DIAGNOSIS — Q2112 Patent foramen ovale: Secondary | ICD-10-CM

## 2015-09-09 LAB — CBC
HCT: 45.5 % (ref 38.5–50.0)
Hemoglobin: 15.6 g/dL (ref 13.2–17.1)
MCH: 31.4 pg (ref 27.0–33.0)
MCHC: 34.3 g/dL (ref 32.0–36.0)
MCV: 91.5 fL (ref 80.0–100.0)
MPV: 9.7 fL (ref 7.5–12.5)
PLATELETS: 214 10*3/uL (ref 140–400)
RBC: 4.97 MIL/uL (ref 4.20–5.80)
RDW: 13.5 % (ref 11.0–15.0)
WBC: 5 10*3/uL (ref 3.8–10.8)

## 2015-09-09 LAB — BASIC METABOLIC PANEL
BUN: 13 mg/dL (ref 7–25)
CALCIUM: 9.7 mg/dL (ref 8.6–10.3)
CO2: 28 mmol/L (ref 20–31)
CREATININE: 1.04 mg/dL (ref 0.60–1.35)
Chloride: 102 mmol/L (ref 98–110)
Glucose, Bld: 87 mg/dL (ref 65–99)
Potassium: 4.7 mmol/L (ref 3.5–5.3)
Sodium: 138 mmol/L (ref 135–146)

## 2015-09-09 LAB — PROTIME-INR
INR: 1.04 (ref <1.50)
Prothrombin Time: 13.7 seconds (ref 11.6–15.2)

## 2015-09-14 ENCOUNTER — Encounter: Payer: Self-pay | Admitting: Cardiovascular Disease

## 2015-09-14 NOTE — Telephone Encounter (Signed)
This encounter was created in error - please disregard.

## 2015-09-14 NOTE — Telephone Encounter (Signed)
New message   Pt verbalized that he missed a call and the vm told him to call office

## 2015-09-15 ENCOUNTER — Other Ambulatory Visit: Payer: Self-pay | Admitting: Cardiovascular Disease

## 2015-09-15 DIAGNOSIS — Q2112 Patent foramen ovale: Secondary | ICD-10-CM

## 2015-09-15 DIAGNOSIS — Q211 Atrial septal defect: Secondary | ICD-10-CM

## 2015-09-16 ENCOUNTER — Encounter (HOSPITAL_COMMUNITY)
Admission: RE | Disposition: A | Discharge status: Home / Self Care | Payer: Self-pay | Source: Ambulatory Visit | Attending: Cardiovascular Disease

## 2015-09-16 ENCOUNTER — Encounter (HOSPITAL_COMMUNITY): Payer: Self-pay | Admitting: Cardiovascular Disease

## 2015-09-16 ENCOUNTER — Ambulatory Visit (HOSPITAL_COMMUNITY)
Admission: RE | Admit: 2015-09-16 | Discharge: 2015-09-17 | Disposition: A | Discharge status: Home / Self Care | Payer: Medicaid Other | Source: Ambulatory Visit | Attending: Cardiovascular Disease | Admitting: Cardiovascular Disease

## 2015-09-16 DIAGNOSIS — Z8673 Personal history of transient ischemic attack (TIA), and cerebral infarction without residual deficits: Secondary | ICD-10-CM | POA: Diagnosis not present

## 2015-09-16 DIAGNOSIS — H548 Legal blindness, as defined in USA: Secondary | ICD-10-CM | POA: Diagnosis not present

## 2015-09-16 DIAGNOSIS — Q2112 Patent foramen ovale: Secondary | ICD-10-CM

## 2015-09-16 DIAGNOSIS — Z87891 Personal history of nicotine dependence: Secondary | ICD-10-CM | POA: Diagnosis not present

## 2015-09-16 DIAGNOSIS — E7030 Albinism, unspecified: Secondary | ICD-10-CM | POA: Insufficient documentation

## 2015-09-16 DIAGNOSIS — Q211 Atrial septal defect: Secondary | ICD-10-CM

## 2015-09-16 DIAGNOSIS — H5501 Congenital nystagmus: Secondary | ICD-10-CM | POA: Insufficient documentation

## 2015-09-16 HISTORY — PX: CARDIAC CATHETERIZATION: SHX172

## 2015-09-16 HISTORY — DX: Patent foramen ovale: Q21.12

## 2015-09-16 HISTORY — DX: Atrial septal defect: Q21.1

## 2015-09-16 LAB — POCT ACTIVATED CLOTTING TIME
Activated Clotting Time: 209 seconds
Activated Clotting Time: 224 seconds
Activated Clotting Time: 173 seconds

## 2015-09-16 SURGERY — ASD/VSD CLOSURE
Anesthesia: LOCAL

## 2015-09-16 MED ORDER — SODIUM CHLORIDE 0.9 % IV SOLN: 250.0000 mL | INTRAVENOUS | Status: DC | PRN

## 2015-09-16 MED ORDER — LIDOCAINE-EPINEPHRINE 1 %-1:100000 IJ SOLN
INTRAMUSCULAR | Status: AC
  Filled 2015-09-16: qty 1

## 2015-09-16 MED ORDER — SODIUM CHLORIDE 0.9 % WEIGHT BASED INFUSION
1.0000 mL/kg/h | INTRAVENOUS | Status: DC
  Administered 2015-09-16: 250 mL via INTRAVENOUS

## 2015-09-16 MED ORDER — SODIUM CHLORIDE 0.9% FLUSH: 3.0000 mL | Freq: Two times a day (BID) | INTRAVENOUS | Status: DC

## 2015-09-16 MED ORDER — HEPARIN SODIUM (PORCINE) 1000 UNIT/ML IJ SOLN
INTRAMUSCULAR | Status: AC
  Filled 2015-09-16: qty 1

## 2015-09-16 MED ORDER — ASPIRIN 81 MG PO CHEW: 81.0000 mg | CHEWABLE_TABLET | ORAL | Status: AC

## 2015-09-16 MED ORDER — MIDAZOLAM HCL 2 MG/2ML IJ SOLN
INTRAMUSCULAR | Status: DC | PRN
  Administered 2015-09-16: 2 mg via INTRAVENOUS

## 2015-09-16 MED ORDER — SODIUM CHLORIDE 0.9 % IV SOLN: INTRAVENOUS | Status: AC

## 2015-09-16 MED ORDER — LIDOCAINE HCL (PF) 1 % IJ SOLN
INTRAMUSCULAR | Status: AC
  Filled 2015-09-16: qty 30

## 2015-09-16 MED ORDER — HEPARIN SODIUM (PORCINE) 1000 UNIT/ML IJ SOLN
INTRAMUSCULAR | Status: DC | PRN
  Administered 2015-09-16: 6000 [IU] via INTRAVENOUS

## 2015-09-16 MED ORDER — CLOPIDOGREL BISULFATE 75 MG PO TABS: 75.0000 mg | ORAL_TABLET | ORAL | Status: AC

## 2015-09-16 MED ORDER — FENTANYL CITRATE (PF) 100 MCG/2ML IJ SOLN
INTRAMUSCULAR | Status: AC
  Filled 2015-09-16: qty 2

## 2015-09-16 MED ORDER — MIDAZOLAM HCL 2 MG/2ML IJ SOLN
INTRAMUSCULAR | Status: AC
  Filled 2015-09-16: qty 2

## 2015-09-16 MED ORDER — SODIUM CHLORIDE 0.9% FLUSH: 3.0000 mL | INTRAVENOUS | Status: DC | PRN

## 2015-09-16 MED ORDER — LIDOCAINE-EPINEPHRINE 1 %-1:100000 IJ SOLN
30.0000 mL | Freq: Once | INTRAMUSCULAR | Status: AC
  Administered 2015-09-16: 2 mL

## 2015-09-16 MED ORDER — FENTANYL CITRATE (PF) 100 MCG/2ML IJ SOLN
INTRAMUSCULAR | Status: DC | PRN
  Administered 2015-09-16: 50 ug via INTRAVENOUS

## 2015-09-16 MED ORDER — HEPARIN (PORCINE) IN NACL 2-0.9 UNIT/ML-% IJ SOLN
INTRAMUSCULAR | Status: DC | PRN
  Administered 2015-09-16: 1000 mL

## 2015-09-16 MED ORDER — HEPARIN (PORCINE) IN NACL 2-0.9 UNIT/ML-% IJ SOLN
INTRAMUSCULAR | Status: AC
  Filled 2015-09-16: qty 1000

## 2015-09-16 MED ORDER — VANCOMYCIN HCL IN DEXTROSE 1-5 GM/200ML-% IV SOLN
1000.0000 mg | Freq: Once | INTRAVENOUS | Status: AC
  Administered 2015-09-16: 1000 mg via INTRAVENOUS

## 2015-09-16 MED ORDER — ONDANSETRON HCL 4 MG/2ML IJ SOLN: 4.0000 mg | Freq: Four times a day (QID) | INTRAMUSCULAR | Status: DC | PRN

## 2015-09-16 MED ORDER — VANCOMYCIN HCL IN DEXTROSE 1-5 GM/200ML-% IV SOLN
INTRAVENOUS | Status: AC
  Filled 2015-09-16: qty 200

## 2015-09-16 MED ORDER — SIMVASTATIN 20 MG PO TABS: 10.0000 mg | ORAL_TABLET | Freq: Every day | ORAL | Status: DC

## 2015-09-16 MED ORDER — SODIUM CHLORIDE 0.9 % WEIGHT BASED INFUSION
3.0000 mL/kg/h | INTRAVENOUS | Status: DC
  Administered 2015-09-16: 3 mL/kg/h via INTRAVENOUS

## 2015-09-16 MED ORDER — IOPAMIDOL (ISOVUE-370) INJECTION 76%
INTRAVENOUS | Status: AC
  Filled 2015-09-16: qty 50

## 2015-09-16 MED ORDER — ACETAMINOPHEN 325 MG PO TABS
650.0000 mg | ORAL_TABLET | ORAL | Status: DC | PRN
  Administered 2015-09-16: 16:00:00 650 mg via ORAL
  Filled 2015-09-16: qty 2

## 2015-09-16 SURGICAL SUPPLY — 11 items
CATH ACUNAV 8FR 90CM (CATHETERS) ×1 IMPLANT
CATH SUPER TORQUE PLUS 6F MPA1 (CATHETERS) ×1 IMPLANT
GUIDEWIRE AMPLATZER 1.5JX260 (WIRE) ×1 IMPLANT
GUIDEWIRE ANGLED .035X260CM (WIRE) ×1 IMPLANT
OCCLUDER AMPLATZER PFO 25MM (Prosthesis & Implant Heart) ×1 IMPLANT
PACK CARDIAC CATHETERIZATION (CUSTOM PROCEDURE TRAY) ×1 IMPLANT
PROTECTION STATION PRESSURIZED (MISCELLANEOUS) ×2
SHEATH PINNACLE 8F 10CM (SHEATH) ×1 IMPLANT
SHEATH PINNACLE 9F 10CM (SHEATH) ×1 IMPLANT
STATION PROTECTION PRESSURIZED (MISCELLANEOUS) IMPLANT
SYSTEM DELIVERY AMPLATZER 8FR (SHEATH) ×1 IMPLANT

## 2015-09-16 NOTE — Interval H&P Note (Signed)
History and Physical Interval Note:  09/16/2015 11:47 AM  Jeffrey Gilmore  has presented today for surgery, with the diagnosis of pfo  The various methods of treatment have been discussed with the patient and family. After consideration of risks, benefits and other options for treatment, the patient has consented to  Procedure(s): ASD/VSD Closure (N/A) as a surgical intervention .  The patient's history has been reviewed, patient examined, no change in status, stable for surgery.  I have reviewed the patient's chart and labs.  Questions were answered to the patient's satisfaction.     Tonny Bollmanooper, Paraskevi Funez

## 2015-09-16 NOTE — H&P (View-Only) (Signed)
Cardiology Office Note Date:  08/28/2015   ID:  [name], DOB [birth year], MRN [id number]  PCP:  Kaleen Mask, MD  Cardiologist:  Tonny Bollman, MD    Chief Complaint  Patient presents with  . New Patient (Initial Visit)    PFO consult. Patient denies chest pain, sob, le edema, or claudication     History of Present Illness: Jeffrey Gilmore is a 25 y.o. male who presents for evaluation of PFO. The patient has a history of albinism and congenital nystagmus. He was healthy without significant medical problems until he presented in May 2016 with dysarthria, left face numbness, and left hand weakness. MRI showed an acute right frontal infarct. The patient did not require IV t-PA because of improving symptoms. Stroke workup was negative except for finding of a PFO on TEE. He had no evidence of vascular disease and his hypercoagulable panel is negative. A TCD bubble study showed Spencer grade III and IV shunting at rest and with Valsalva, respectively.   Today, he denies symptoms of palpitations, chest pain, shortness of breath, orthopnea, PND, lower extremity edema, dizziness, or syncope.  Past Medical History  Diagnosis Date  . Hypoglycemic reaction   . Albinism (HCC)   . Nystagmus   . Legally blind   . PFO (patent foramen ovale)   . Stroke [facility])     Past Surgical History  Procedure Laterality Date  . Eye surgery      1997, 2010  . Tee without cardioversion N/A 09/30/2014    Procedure: TRANSESOPHAGEAL ECHOCARDIOGRAM (TEE);  Surgeon: Wendall Stade, MD;  Location: [facility] ENDOSCOPY;  Service: Cardiovascular;  Laterality: N/A;    Current Outpatient Prescriptions  Medication Sig Dispense Refill  . aspirin EC 81 MG tablet Take 81 mg by mouth daily.    . simvastatin (ZOCOR) 10 MG tablet Take 1 tablet (10 mg total) by mouth daily at 6 PM. 90 tablet 3   No current facility-administered medications for this visit.    Allergies:   Amoxicillin and Penicillins   Social History:   The patient  reports that he has quit smoking. His smoking use included Pipe. He has never used smokeless tobacco. He reports that he drinks about 0.6 oz of alcohol per week. He reports that he does not use illicit drugs.   Family History:  The patient's  family history includes Alcoholism in his maternal grandmother; Cirrhosis in his maternal grandmother; Diabetes in his paternal grandfather and paternal grandmother; Healthy in his father and mother; Hypertension in his paternal grandfather and paternal grandmother; Prostate cancer in his paternal grandfather.    ROS:  Please see the history of present illness.  All other systems are reviewed and negative.    PHYSICAL EXAM: VS:  BP 112/70 mmHg  Pulse 68  Ht  (1.854 m)  Wt 186 lb 3.2 oz (84.46 kg)  BMI 24.57 kg/m2 , BMI Body mass index is 24.57 kg/(m^2). GEN: Well nourished, well developed, young man in no acute distress HEENT: normal Neck: no JVD, no masses. No carotid bruits Cardiac: RRR without murmur or gallop                Respiratory:  clear to auscultation bilaterally, normal work of breathing GI: soft, nontender, nondistended, + BS MS: no deformity or atrophy Ext: no pretibial edema, pedal pulses 2+= bilaterally Skin: warm and dry, no rash Neuro:  Strength and sensation are intact Psych: euthymic mood, full affect  EKG:  EKG is ordered today.  The ekg ordered today shows NSR  Recent Labs: 10/07/2014: BUN 9; Creatinine, Ser 1.10; Hemoglobin 14.8; Platelets 222; Potassium 4.3; Sodium 140   Lipid Panel     Component Value Date/Time   CHOL 140 09/30/2014 0607   TRIG 74 09/30/2014 0607   HDL 38* 09/30/2014 0607   CHOLHDL 3.7 09/30/2014 0607   VLDL 15 09/30/2014 0607   LDLCALC 87 09/30/2014 0607      Wt Readings from Last 3 Encounters:  08/28/15 186 lb 3.2 oz (84.46 kg)  08/13/15 184 lb 6.4 oz (83.643 kg)  04/22/15 178 lb 3.2 oz (80.831 kg)     Cardiac Studies Reviewed: TCD bubble study 08/19/2015: Moderate  intracardiac right to left shunt  TEE 09/30/2014: Normal valves Normal LV EF 60% No LAA thrombus Very small PFO by color flow Positive bubble study only with valsalva Normal RV Normal aorta No source of embolus  MRI Brain: IMPRESSION: 1. 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 No associated hemorrhage or mass effect. 2. Otherwise normal brain MRI.  ASSESSMENT AND PLAN: PFO with cryptogenic stroke: I have personally reviewed the patient's TEE study from last year and this showed no evidence of myocardial or valvular disease. There is a PFO with color flow doppler showing bidirectional shunting and positive bubble study. TCD study is also positive for right-to-left shunting. The patient's ROPE score = 10, based on his young age and lack of all other stroke risk factors. This suggests a higher probability that his stroke was related to a 'pathogenic' PFO. We reviewed available clinical trial data comparing device closure and standard medical therapy for secondary stroke prevention. While there have not been clinically significant reduction in primary endpoints, long-term follow-up has shown reduction in stroke events with device closure especially in younger patients. The patient would like to proceed with transcatheter closure. He will be started on plavix prior to the procedure and understands he will take this in addition to ASA for 3-6 months after PFO closure.  I reviewed the risks, indications, and alternatives to transcatheter closure with the patient and his aunt who is present today. Specific risks include bleeding, infection, device embolization, stroke, cardiac perforation, tamponade, arrhythmia, MI, and late device erosion. He understands these serious risks occur at low incidence of < 1%.   Current medicines are reviewed with the patient today.  The patient does not have  concerns regarding medicines.  Labs/ tests ordered today include:  No orders of the defined types were placed in this encounter.    Enzo BiSigned, Aalyiah Camberos, MD  08/28/2015 11:34 AM    [facility] [address], [place], [place]  [address] Phone: [phone]) [phone]; Fax: [phone]) [phone]

## 2015-09-16 NOTE — Progress Notes (Signed)
Site area: Right groin a 8, 9 french venous sheath was removed  Site Prior to Removal:  Level 0  Pressure Applied For 45 MINUTES    Bedrest Beginning at 1345p  Manual:   Yes.    Patient Status During Pull:  stable  Post Pull Groin Site:  Level 0  Post Pull Instructions Given:  Yes.    Post Pull Pulses Present:  Yes.    Dressing Applied:  Yes.    Comments:  VS remain stable during sheath pull

## 2015-09-17 ENCOUNTER — Encounter (HOSPITAL_COMMUNITY): Payer: Self-pay | Admitting: Student

## 2015-09-17 ENCOUNTER — Ambulatory Visit (HOSPITAL_BASED_OUTPATIENT_CLINIC_OR_DEPARTMENT_OTHER): Payer: Medicaid Other

## 2015-09-17 DIAGNOSIS — E7030 Albinism, unspecified: Secondary | ICD-10-CM | POA: Diagnosis not present

## 2015-09-17 DIAGNOSIS — Q211 Atrial septal defect: Secondary | ICD-10-CM | POA: Diagnosis not present

## 2015-09-17 DIAGNOSIS — H5501 Congenital nystagmus: Secondary | ICD-10-CM | POA: Diagnosis not present

## 2015-09-17 DIAGNOSIS — H548 Legal blindness, as defined in USA: Secondary | ICD-10-CM | POA: Diagnosis not present

## 2015-09-17 LAB — ECHOCARDIOGRAM LIMITED
HEIGHTINCHES: 73 in
WEIGHTICAEL: 2938.29 [oz_av]

## 2015-09-17 MED ORDER — LIDOCAINE-EPINEPHRINE 1 %-1:100000 IJ SOLN
INTRAMUSCULAR | Status: AC
  Filled 2015-09-17: qty 1

## 2015-09-17 MED ORDER — ASPIRIN EC 81 MG PO TBEC
81.0000 mg | DELAYED_RELEASE_TABLET | Freq: Every day | ORAL | Status: DC
  Administered 2015-09-17: 10:00:00 81 mg via ORAL
  Filled 2015-09-17: qty 1

## 2015-09-17 MED ORDER — LIDOCAINE-EPINEPHRINE 1 %-1:100000 IJ SOLN
30.0000 mL | Freq: Once | INTRAMUSCULAR | Status: AC
  Administered 2015-09-17: 13:00:00 2 mL via INTRADERMAL
  Filled 2015-09-17: qty 30

## 2015-09-17 MED ORDER — CLOPIDOGREL BISULFATE 75 MG PO TABS
75.0000 mg | ORAL_TABLET | Freq: Every day | ORAL | Status: DC
  Administered 2015-09-17: 75 mg via ORAL
  Filled 2015-09-17: qty 1

## 2015-09-17 NOTE — Progress Notes (Signed)
Hospital Problem List     Active Problems:   PFO (patent foramen ovale)    Patient Profile:   Primary Cardiologist: Dr. Excell Seltzerooper  25 yo male w/ PMH of albinism, congenital nystagmus and cryptogenic stroke in 09/2014 who underwent a TEE and had a positive bubble study. Presented for PFO closure on 08/28/2015.  Subjective   Reports mild oozing at his right groin site throughout the night. Denies any chest pain, palpitations, or shortness of breath,  Inpatient Medications    . aspirin EC  81 mg Oral Daily  . clopidogrel  75 mg Oral Daily  . simvastatin  10 mg Oral q1800  . sodium chloride flush  3 mL Intravenous Q12H    Vital Signs    Filed Vitals:   09/16/15 1946 09/16/15 2000 09/17/15 0450 09/17/15 0802  BP: 113/55  107/56   Pulse: 74  63   Temp: 98.1 F (36.7 C)  97.8 F (36.6 C) 97.9 F (36.6 C)  TempSrc: Oral  Oral Oral  Resp: 20 18 13    Height:      Weight:   183 lb 10.3 oz (83.3 kg)   SpO2: 99%  98%     Intake/Output Summary (Last 24 hours) at 09/17/15 0854 Last data filed at 09/16/15 2300  Gross per 24 hour  Intake    360 ml  Output    500 ml  Net   -140 ml   Filed Weights   09/16/15 0900 09/17/15 0450  Weight: 182 lb (82.555 kg) 183 lb 10.3 oz (83.3 kg)    Physical Exam    General: Well developed, well nourished, male appearing in no acute distress. Head: Normocephalic, atraumatic.  Neck: Supple without bruits, JVD not elevated. Lungs:  Resp regular and unlabored, CTA without wheezing or rales. Heart: RRR, S1, S2, no S3, S4, or murmur; no rub. Abdomen: Soft, non-tender, non-distended with normoactive bowel sounds. No hepatomegaly. No rebound/guarding. No obvious abdominal masses. Extremities: No clubbing, cyanosis, or edema. Distal pedal pulses are 2+ bilaterally. Right groin site with mild oozing. No evidence of a hematoma or bruit. Neuro: Alert and oriented X 3. Moves all extremities spontaneously. Psych: Normal affect.  Labs     None  Telemetry    NSR, HR in 60's - 80's. No atopic events.  ECG    NSR, HR 78. No acute ST or T-wave changes.   Cardiac Studies and Radiology    ASD/VSD Closure: 09/16/2015 INDICATION: PFO, Cryptogenic Stroke  PROCEDURAL DETAILS:  The right groin was prepped, draped, and anesthetized with 1% lidocaine. Using modified Seldinger technique, an 8 Fr [place] sheath was introduced into the right femoral vein and a 9 Fr sheath was inserted into the ipsilateral right femoral vein. Heparin 6000 units was given for anticoagulation. A Siemens ICE probe (8 Fr) is advanced to the heart and standard imaging is performed. This demonstrates a PFO with spontaneous bidirectional flow. TEE findings are confirmed. There is no significant valvular disease. LV function appears normal. A therapeutic ACT was achieved. The PFO was crossed with an angled Glidewire and a multipurpose catheter. This is advanced into the left upper pulmonary vein and changed out for a long 0.035 inch Amplatzer wire. Evaluation of the PFO with intracardiac echo while the wires across demonstrates a maximum diameter of approximately 5 mm. The PFO tunnel appears fairly short. We elected to close the defect with a 25 mm Amplatzer PFO occluder. The devices prepped in sterile saline using normal technique. An 8  [place] torque view sheath was advanced into the left atrium at the level of the origin of the left upper pulmonary vein. The devices deployed without difficulty and a full interrogation with intracardiac echo and fluoroscopy is performed. This confirms appropriate device position. The devices released and a completion agitated saline study is performed demonstrating no residual right to left shunting. The patient tolerated the entire procedure well and there were no immediate complications.  During this procedure the patient is administered a total of Versed 2 mg and Fentanyl 50 mg to achieve and maintain moderate conscious sedation. The  patient's heart rate, blood pressure, and oxygen saturation are monitored continuously during the procedure. The period of conscious sedation is 31 minutes, of which I was present face-to-face 100% of this time.  Estimated blood loss <50 mL. There were no immediate complications during the procedure.     PFO/ASD There is a PFO. There is not an atrial septal aneurysm present. Agitated saline study indicates right-to-left shunt at rest. Balloon sizing was not performed. Intracardiac echo used for procedural guidance. 25 mm Amplatzer PFO occluder was used to close the defect. Agitated saline study indicates no right-to-left shunt post closure. There were no immediate procedural complications.      Echocardiogram: Pending  Assessment & Plan    1. PFO Closure - diagnosed with a cryptogenic stroke in 09/2014 who underwent a TEE and had a positive bubble study. Presented for PFO closure on 08/28/2015. - study showed a right to left shunt at rest. 25 mm Amplatzer PFO occluder was used to close the defect. No right-to-left shunt identified post-procedure.  - Limited echo pending, to be performed this morning.  - will order ASA and Plavix to resume this AM. - will need to arrange a follow-up echo and appointment with Dr. Excell Seltzer prior to discharge.    Lorri Frederick , PA-C 8:54 AM 09/17/2015 Pager: [phone]  Patient seen, examined. Available data reviewed. Agree with findings, assessment, and plan as outlined by Randall An, PA-C. Exam as above, right groin site clear but continued oozing this morning noted. Now with pressure dressing on. Echo reviewed and device in appropriate position. No effusion. Instructions reviewed with patient and family. Will arrange FU in 30 days with a limited echo at that time. If continued oozing will inject subQ with lidocaine with epi.  Tonny Bollman, M.D. 09/17/2015 10:07 AM

## 2015-09-17 NOTE — Progress Notes (Signed)
Patient ambulated 600 feet without complications.  Right femoral site dry and intact.  Notified Randall AnBrittany Strader, [place] and discharged patient home.

## 2015-09-17 NOTE — Progress Notes (Signed)
Pressure drsg remained on groin site for two hours with no diminishing of ooze.  Cath lab personnel to come and evaluate for epi-lido injection.  Dr. Excell Seltzerooper aware.

## 2015-09-17 NOTE — Progress Notes (Signed)
Superior right groin site still oozing after two hours of pressure dressing and 15-6820min of manual pressure per Rosanne AshingJim, RN.  2cc of lido w/ epi was administered to sheath site w/ 25g needle. Oozing was stopped and 20 min of manual pressure was applied.    Oozing stopped. Site was dressed with 2x2 and tegaderm. Patient was giving instructions with verbal understanding.   Randall AnBrittany Strader, PA spoke w/ mother and patient.  Report given to Rosanne AshingJim, Charity fundraiserN.

## 2015-09-17 NOTE — Progress Notes (Signed)
  Echocardiogram 2D Echocardiogram has been performed.  Jeffrey SavoyCasey N Vincen Gilmore 09/17/2015, 9:49 AM

## 2015-09-17 NOTE — Discharge Summary (Signed)
Discharge Summary    Patient ID: Jeffrey Gilmore,  MRN: [id number], DOB/AGE: 25-11-1990 25 y.o.  Admit date: 09/16/2015 Discharge date: 09/17/2015  Primary Care Provider: Leonard Downing Primary Cardiologist: Dr. Burt Knack  Discharge Diagnoses    Active Problems:   PFO (patent foramen ovale)   History of Present Illness     Jeffrey Gilmore is a 25 yo male with past medical history of albinism, congenital nystagmus and cryptogenic stroke in 09/2014 who underwent a TEE and had a positive bubble study. He had met with Dr. Burt Knack for evaluation of his PFO on 08/28/2015 and agreed to proceed with PFO closure. He presented for the procedure on 09/16/2015.  Hospital Course     Consultants: None  The study showed a right to left shunt at rest. A 25 mm Amplatzer PFO occluder was used to close the defect and no right-to-left shunt was identified post-procedure. No complications were noted. The full report is included below.  Overnight and the following morning, he had continual mild oozing at his right groin site. Manual pressure was applied along with a pressure dressing but continued oozing was noted. The cath lab technician came to evaluate the site and administered 2 cc of lidocaine with epinephrine and the oozing resolved within 20 minutes.   Several hours afterwards, he ambulated down the hallway without difficulty and his right groin site remained stable. He denied any chest pain, palpitations, or shortness of breath. A repeat echocardiogram was obtained and showed the device was positioned correctly with no residual shunt by color flow doppler.  He was last examined by Dr. Burt Knack and deemed stable for discharge. A staff message was sent to arrange for a repeat limited echo in 30 days along with follow-up with Dr. Burt Knack. He will continue on ASA and Plavix. He was discharged home in stable condition.    _____________  Discharge Vitals Blood pressure 126/75, pulse 70, temperature 97.7  F (36.5 C), temperature source Oral, resp. rate 22, height '6\' 1"'  (1.854 m), weight 183 lb 10.3 oz (83.3 kg), SpO2 96 %.  Filed Weights   09/16/15 0900 09/17/15 0450  Weight: 182 lb (82.555 kg) 183 lb 10.3 oz (83.3 kg)    Labs & Radiologic Studies    None  Diagnostic Studies/Procedures     ASD/VSD Closure: 09/16/2015 PROCEDURAL DETAILS:  The right groin was prepped, draped, and anesthetized with 1% lidocaine. Using modified Seldinger technique, an 8 Fr [place] sheath was introduced into the right femoral vein and a 9 Fr sheath was inserted into the ipsilateral right femoral vein. Heparin 6000 units was given for anticoagulation. A Siemens ICE probe (8 Fr) is advanced to the heart and standard imaging is performed. This demonstrates a PFO with spontaneous bidirectional flow. TEE findings are confirmed. There is no significant valvular disease. LV function appears normal. A therapeutic ACT was achieved. The PFO was crossed with an angled Glidewire and a multipurpose catheter. This is advanced into the left upper pulmonary vein and changed out for a long 0.035 inch Amplatzer wire. Evaluation of the PFO with intracardiac echo while the wires across demonstrates a maximum diameter of approximately 5 mm. The PFO tunnel appears fairly short. We elected to close the defect with a 25 mm Amplatzer PFO occluder. The devices prepped in sterile saline using normal technique. An 8 [place] torque view sheath was advanced into the left atrium at the level of the origin of the left upper pulmonary vein. The devices deployed without difficulty  and a full interrogation with intracardiac echo and fluoroscopy is performed. This confirms appropriate device position. The devices released and a completion agitated saline study is performed demonstrating no residual right to left shunting. The patient tolerated the entire procedure well and there were no immediate complications.  During this procedure the patient is  administered a total of Versed 2 mg and Fentanyl 50 mg to achieve and maintain moderate conscious sedation. The patient's heart rate, blood pressure, and oxygen saturation are monitored continuously during the procedure. The period of conscious sedation is 31 minutes, of which I was present face-to-face 100% of this time.  Estimated blood loss <50 mL. There were no immediate complications during the procedure.     PFO/ASD There is a PFO. There is not an atrial septal aneurysm present. Agitated saline study indicates right-to-left shunt at rest. Balloon sizing was not performed. Intracardiac echo used for procedural guidance. 25 mm Amplatzer PFO occluder was used to close the defect. Agitated saline study indicates no right-to-left shunt post closure. There were no immediate procedural complications.    Echocardiogram: 09/16/2015 Study Conclusions  - Left ventricle: The cavity size was normal. Systolic function was  normal. The estimated ejection fraction was in the range of 60%  to 65%. Wall motion was normal; there were no regional wall  motion abnormalities. - Aortic valve: There was no regurgitation. - Mitral valve: Calcified annulus. There was no regurgitation. - Right ventricle: The cavity size was normal. Wall thickness was  normal. Systolic function was normal. - Atrial septum: A 25 mm Amplatzer closure device was present.  There is no residual shunt by color flow Doppler. - Tricuspid valve: There was no regurgitation. - Inferior vena cava: The vessel was normal in size. The  respirophasic diameter changes were in the normal range (>= 50%),  consistent with normal central venous pressure.   Disposition   Pt is being discharged home today in good condition.  Follow-up Plans & Appointments    Follow-up Information    Follow up with Sherren Mocha, MD.   Specialty:  Cardiology   Why:  The office will call you within the next 2-3 business day to arrange follow-up.  If you do not hear from them, please call the number above.   Contact information:   [address] [place] [address] [phone]      Discharge Instructions    Diet - low sodium heart healthy    Complete by:  As directed      Discharge instructions    Complete by:  As directed   PLEASE REMEMBER TO BRING ALL OF YOUR MEDICATIONS TO EACH OF YOUR FOLLOW-UP OFFICE VISITS.  PLEASE ATTEND ALL SCHEDULED FOLLOW-UP APPOINTMENTS.   Activity: Increase activity slowly as tolerated. You may shower, but no soaking baths (or swimming) for 1 week. No driving for 24 hours. No lifting over 5 lbs for 1 week. No sexual activity for 1 week.   You May Return to Work: in 1 week (if applicable)  Wound Care: You may wash cath site gently with soap and water. Keep cath site clean and dry. If you notice pain, swelling, bleeding or pus at your cath site, please call [phone].     Increase activity slowly    Complete by:  As directed            Discharge Medications   Current Discharge Medication List    CONTINUE these medications which have NOT CHANGED   Details  aspirin  EC 81 MG tablet Take 81 mg by mouth daily.    clopidogrel (PLAVIX) 75 MG tablet Take 1 tablet (75 mg total) by mouth daily. Qty: 30 tablet, Refills: 6   Associated Diagnoses: PFO (patent foramen ovale)    simvastatin (ZOCOR) 10 MG tablet Take 1 tablet (10 mg total) by mouth daily at 6 PM. Qty: 90 tablet, Refills: 3          Allergies Allergies  Allergen Reactions  . Amoxicillin Swelling and Rash    Unspecified area of swelling  . Penicillins Rash    Severity Medium due to po Amoxicillin > swelling     Outstanding Labs/Studies   None  Duration of Discharge Encounter   Greater than 30 minutes including physician time.  Signed, Erma Heritage, PA-C 09/17/2015, 3:46 PM

## 2015-09-17 NOTE — Progress Notes (Signed)
Patient with reoccuring and persistent right femoral veinous ooze.  Manual pressure held for 15 minutes with no effect.  Pressure drsg applied; Dr. Excell Seltzerooper aware.

## 2015-09-23 ENCOUNTER — Other Ambulatory Visit: Payer: Self-pay

## 2015-09-23 DIAGNOSIS — Q211 Atrial septal defect: Secondary | ICD-10-CM

## 2015-09-23 DIAGNOSIS — Q2112 Patent foramen ovale: Secondary | ICD-10-CM

## 2015-09-29 ENCOUNTER — Ambulatory Visit: Payer: Medicaid Other | Admitting: Physician Assistant

## 2015-10-21 ENCOUNTER — Ambulatory Visit (INDEPENDENT_AMBULATORY_CARE_PROVIDER_SITE_OTHER): Payer: Medicaid Other | Admitting: Cardiovascular Disease

## 2015-10-21 ENCOUNTER — Other Ambulatory Visit: Payer: Self-pay | Admitting: Cardiovascular Disease

## 2015-10-21 ENCOUNTER — Ambulatory Visit (HOSPITAL_COMMUNITY): Payer: Medicaid Other | Attending: Cardiovascular Disease

## 2015-10-21 ENCOUNTER — Encounter: Payer: Self-pay | Admitting: Cardiovascular Disease

## 2015-10-21 ENCOUNTER — Other Ambulatory Visit: Payer: Self-pay

## 2015-10-21 VITALS — BP 120/68 | HR 60 | Ht 73.0 in | Wt 186.0 lb

## 2015-10-21 DIAGNOSIS — Q211 Atrial septal defect: Secondary | ICD-10-CM

## 2015-10-21 DIAGNOSIS — Q2112 Patent foramen ovale: Secondary | ICD-10-CM

## 2015-10-21 NOTE — Progress Notes (Signed)
Cardiology Office Note Date:  10/21/2015   ID:  Jeffrey Gilmore, DOB July 29, 1990, MRN [id number]  PCP:  Kaleen MaskELKINS,WILSON OLIVER, MD  Cardiologist:  Tonny Bollmanooper, Patryk Conant, MD    Chief Complaint  Patient presents with  . PFO    s/p closure     History of Present Illness: Jeffrey Gilmore is a 25 y.o. male who presents for follow-up after transcatheter PFO closure which ws performed 08/28/2015. His procedure was uncomplicated. He was treated with a 25 mm Amplatzer PFO occluder with no residual shunt demonstrated after device deployment. He returns today for follow-up and is doing well. Today, he denies symptoms of palpitations, chest pain, shortness of breath, orthopnea, PND, lower extremity edema, dizziness, or syncope.  Past Medical History  Diagnosis Date  . Hypoglycemic reaction   . Albinism (HCC)   . Nystagmus   . Legally blind   . Stroke (HCC)   . PFO (patent foramen ovale) 09/16/2015    a. s/p placement of 25 mm Amplatzer PFO occluder    Past Surgical History  Procedure Laterality Date  . Eye surgery      1997, 2010  . Tee without cardioversion N/A 09/30/2014    Procedure: TRANSESOPHAGEAL ECHOCARDIOGRAM (TEE);  Surgeon: Wendall StadePeter C Nishan, MD;  [facility];  Service: Cardiovascular;  Laterality: N/A;  . Cardiac catheterization N/A 09/16/2015    Procedure: ASD/VSD Closure;  Surgeon: Tonny BollmanMichael Benoit Meech, MD;  [facility] CV LAB;  Service: Cardiovascular;  Laterality: N/A;    Current Outpatient Prescriptions  Medication Sig Dispense Refill  . aspirin EC 81 MG tablet Take 81 mg by mouth daily.    . clopidogrel (PLAVIX) 75 MG tablet Take 1 tablet (75 mg total) by mouth daily. 30 tablet 6  . simvastatin (ZOCOR) 10 MG tablet Take 1 tablet (10 mg total) by mouth daily at 6 PM. 90 tablet 3   No current facility-administered medications for this visit.    Allergies:   Amoxicillin and Penicillins   Social History:  The patient  reports that he has quit smoking. His smoking use  included Pipe. He has never used smokeless tobacco. He reports that he drinks about 0.6 oz of alcohol per week. He reports that he does not use illicit drugs.   Family History:  The patient's  family history includes Alcoholism in his maternal grandmother; Cirrhosis in his maternal grandmother; Diabetes in his paternal grandfather and paternal grandmother; Healthy in his father and mother; Hypertension in his paternal grandfather and paternal grandmother; Prostate cancer in his paternal grandfather.   ROS:  Please see the history of present illness. All other systems are reviewed and negative.   PHYSICAL EXAM: VS:  BP 120/68 mmHg  Pulse 60  Ht 6\' 1"  (1.854 m)  Wt 186 lb (84.369 kg)  BMI 24.55 kg/m2 , BMI Body mass index is 24.55 kg/(m^2). GEN: Well nourished, well developed, in no acute distress HEENT: normal Neck: no JVD, no masses.  Cardiac: RRR without murmur or gallop                Respiratory:  clear to auscultation bilaterally, normal work of breathing GI: soft, nontender, nondistended, + BS MS: no deformity or atrophy Ext: no pretibial edema Skin: warm and dry, no rash Neuro:  Strength and sensation are intact Psych: euthymic mood, full affect  EKG:  EKG is not ordered today.  Recent Labs: 09/09/2015: BUN 13; Creat 1.04; Hemoglobin 15.6; Platelets 214; Potassium 4.7; Sodium 138   Lipid Panel  Component Value Date/Time   CHOL 140 09/30/2014 0607   TRIG 74 09/30/2014 0607   HDL 38* 09/30/2014 0607   CHOLHDL 3.7 09/30/2014 0607   VLDL 15 09/30/2014 0607   LDLCALC 87 09/30/2014 0607      Wt Readings from Last 3 Encounters:  10/21/15 186 lb (84.369 kg)  09/17/15 183 lb 10.3 oz (83.3 kg)  08/28/15 186 lb 3.2 oz (84.46 kg)     Cardiac Studies Reviewed: Echo: personally reviewed. PFO occluder in appropriate position. No effusion. No abnormalities seen.  ASSESSMENT AND PLAN: PFO s/p transcatheter closure. Pt doing well without recurrent symptoms. Plan:  Continue  ASA and plavix through 6 months  TCD bubble study - will arrange with Dr Roda Shutters at 6 months   SBE prophylaxis discussed - needs to follow for 6 months  No restrictions  FU limited echo 6 months and OV at that time  Current medicines are reviewed with the patient today.  The patient does not have concerns regarding medicines.  Labs/ tests ordered today include:  No orders of the defined types were placed in this encounter.   Disposition:   FU as above  Signed, Tonny Bollman, MD  10/21/2015 9:41 AM    [facility] [address] [place], [place], [place]  [address] Phone: [phone]; Fax: [phone]

## 2015-10-21 NOTE — Patient Instructions (Signed)
Medication Instructions:  Your physician recommends that you continue on your current medications as directed. Please refer to the Current Medication list given to you today.  Labwork: No new orders.   Testing/Procedures: Your physician has requested that you have a limited echocardiogram in 6 MONTHS Echocardiography is a painless test that uses sound waves to create images of your heart. It provides your doctor with information about the size and shape of your heart and how well your heart's chambers and valves are working. This procedure takes approximately one hour. There are no restrictions for this procedure.  Your physician has recommended a Transcranial Doppler in 6 MONTHS.   Follow-Up: Your physician wants you to follow-up in: 6 MONTHS with Dr Excell Seltzerooper.  You will receive a reminder letter in the mail two months in advance. If you don't receive a letter, please call our office to schedule the follow-up appointment.   Any Other Special Instructions Will Be Listed Below (If Applicable).  Your physician discussed the importance of taking an antibiotic prior to any dental, gastrointestinal, genitourinary procedures to prevent damage to the heart valves from infection. (The first 6 months after PFO closure)    If you need a refill on your cardiac medications before your next appointment, please call your pharmacy.

## 2016-01-19 ENCOUNTER — Other Ambulatory Visit: Payer: Self-pay | Admitting: Neurology

## 2016-02-10 ENCOUNTER — Ambulatory Visit (INDEPENDENT_AMBULATORY_CARE_PROVIDER_SITE_OTHER): Payer: Medicaid Other | Admitting: Neurology

## 2016-02-10 ENCOUNTER — Encounter: Payer: Self-pay | Admitting: Neurology

## 2016-02-10 VITALS — BP 105/68 | HR 75 | Ht 73.0 in | Wt 186.8 lb

## 2016-02-10 DIAGNOSIS — H5501 Congenital nystagmus: Secondary | ICD-10-CM | POA: Diagnosis not present

## 2016-02-10 DIAGNOSIS — I63411 Cerebral infarction due to embolism of right middle cerebral artery: Secondary | ICD-10-CM | POA: Diagnosis not present

## 2016-02-10 DIAGNOSIS — E785 Hyperlipidemia, unspecified: Secondary | ICD-10-CM

## 2016-02-10 DIAGNOSIS — Q211 Atrial septal defect: Secondary | ICD-10-CM

## 2016-02-10 DIAGNOSIS — Q2112 Patent foramen ovale: Secondary | ICD-10-CM

## 2016-02-10 MED ORDER — SIMVASTATIN 10 MG PO TABS: 10.0000 mg | ORAL_TABLET | Freq: Every day | ORAL | 3 refills | Status: DC

## 2016-02-10 NOTE — Patient Instructions (Addendum)
-   continue ASA 81 and plavix for another one month and then ASA alone - continue low dose zocor for stroke prevention - follow up Dr. Excell Seltzerooper to post PFO closure.  - Follow up with your primary care physician for stroke risk factor modification. Recommend maintain blood pressure goal <130/80, diabetes with hemoglobin A1c goal below 6.5% and lipids with LDL cholesterol goal below 70 mg/dL.  - call us back in November if you did not get call for TCD bubble study. - follow up in 6 months.

## 2016-02-10 NOTE — Progress Notes (Signed)
STROKE NEUROLOGY FOLLOW UP NOTE  NAME: Jeffrey Gilmore DOB: 1990/07/28  REASON FOR VISIT: stroke follow up HISTORY FROM: chart and pt  Today we had the pleasure of seeing [name] in follow-up at our Neurology Clinic. Pt was accompanied by no one.   History Summary Mr. Jeffrey Gilmore is a 25 y.o. male with history of albinism, congenital nystagmus and hypoglycemic reaction was admitted on 09/28/14 for transient dysarthria, left face numbness-weakness, left hand weakness. He did not receive IV t-PA due to resolution of symptoms. MRI showed right frontal linear shaped DWI changes, likely infarct. Extensive stroke work up done including, CTA head and neck, LE venous doppler, UDS, cerebral angio, TEE and hypercoagulable and autoimmune work up all negative except a very small PFO. LDL 87 and A1C 5.6. He was put on ASA 81mg  and zocor and discharged in good condition. He admitted to smoke Hooka in the past.  Follow up 12/17/14 (YY) - pt has been doing well, no recurrent symptoms. Found to have depression and put on celexa 10mg .  Follow up 04/22/15 - the patient has been doing well. No recurrent symptoms. He stopped celexa by himself in 02/2015 as he does not feel depression anymore. He has quit smoking hooka. Denies illicit drug use. No complains.  Follow-up 08/13/2015 - pt has been doing well. No recurrent symptoms. Had TCD bubble study yesterday showed PFO spencer degree III at rest and IV on Valsalva. Due to lack of other risk factors, young age, moderate PFO, will refer to Dr. Excell Seltzer for consideration of PFO closure.   Interval History During the interval time, patient was referred to Dr. Excell Seltzer for PFO closure which was done in 08/2015 without complication. Patient was put on aspirin and Plavix for 6 months. He follow-up with Dr. Excell Seltzer in May and the plan to repeat 2-D echo and TCD bubble studies in 6 months. Patient has been doing well, without complaints. BP today in clinic 105/68. Needs refill  for Zocor.  REVIEW OF SYSTEMS: Full 14 system review of systems performed and notable only for those listed below and in HPI above, all others are negative:  Constitutional:   Cardiovascular:  Ear/Nose/Throat:   Skin:  Eyes:   Respiratory:   Gastroitestinal:   Genitourinary:  Hematology/Lymphatic:   Endocrine:  Musculoskeletal:   Allergy/Immunology:   Neurological:   Psychiatric:  Sleep:   The following represents the patient's updated allergies and side effects list: Allergies  Allergen Reactions  . Amoxicillin Swelling and Rash    Unspecified area of swelling  . Penicillins Rash    Severity Medium due to po Amoxicillin > swelling    The neurologically relevant items on the patient's problem list were reviewed on today's visit.  Neurologic Examination  A problem focused neurological exam (12 or more points of the single system neurologic examination, vital signs counts as 1 point, cranial nerves count for 8 points) was performed.  Blood pressure 105/68, pulse 75, height 6\' 1"  (1.854 m), weight 186 lb 12.8 oz (84.7 kg).  General - Well nourished, well developed, in no apparent distress.  Ophthalmologic - Fundi not visualized due to nystagmus.  Cardiovascular - Regular rate and rhythm with no murmur.  Mental Status -  Level of arousal and orientation to time, place, and person were intact. Language including expression, naming, repetition, comprehension was assessed and found intact. Fund of Knowledge was assessed and was intact.  Cranial Nerves II - XII - II - Visual field intact OU.  III, IV, VI - Extraocular movements intact. V - Facial sensation intact bilaterally. VII - Facial movement intact bilaterally. VIII - Hearing & vestibular intact bilaterally, bidirectional nystagmus constant. X - Palate elevates symmetrically. XI - Chin turning & shoulder shrug intact bilaterally. XII - Tongue protrusion intact.  Motor Strength - The patient's strength was normal  in all extremities and pronator drift was absent.  Bulk was normal and fasciculations were absent.   Motor Tone - Muscle tone was assessed at the neck and appendages and was normal.  Reflexes - The patient's reflexes were 1+ in all extremities and he had no pathological reflexes.  Sensory - Light touch, temperature/pinprick, vibration and proprioception, and Romberg testing were assessed and were normal.    Coordination - The patient had normal movements in the hands and feet with no ataxia or dysmetria.  Tremor was absent.  Gait and Station - The patient's transfers, posture, gait, station, and turns were observed as normal.  Data reviewed: I personally reviewed the images and agree with the radiology interpretations.  Dg Chest 2 View 09/29/2014 No active cardiopulmonary disease.   Mr Brain Wo Contrast 09/29/2014 1. 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 No associated hemorrhage or mass effect. 2. Otherwise normal brain MRI.   Ct Angio Head and neck W/cm &/or Wo Cm 09/29/2014 IMPRESSION: Right frontal lobe infarct better demonstrated on recent MR. No medium or large size vessel significant stenosis. No obvious changes of vasculitis (subtle branch vessel vasculitis changes may not be detected by CT angiography). Slightly prominent vessels adjacent to the cavernous sinus region greater on right may be related to the late phase of injection rather than vascular malformation. To help confirm this and exclude underlying vascular malformation, MR angiogram and MR venography can be obtained. Prominent soft tissue anterior superior mediastinum most likely thymus. Prominent lymphoid tissue may be reactive in origin related to recent infection. More worrisome process such as lymphoma felt to be unlikely in this setting of normal CBC and differential.   LE venous Doppler negative for  DVT  TEE Normal valves. Normal LV EF 60%. No LAA thrombus. Very small PFO by color flow. Positive bubble study only with valsalva. Normal RV. Normal aorta. No source of embolus.  Cerebral angio S/P 4 vessel cerebral arteriogram. RT CFA approach. 1.No occlusions,stenosis,dissections or AV shunting noted. 2.Venous outflow WNLs. 3.No aneurysms or AVM noted angiographically  TCD MES 08/19/2015 - negative for microemboli  TCD bubble study 08/19/2015- spencer degree III at rest and IV with valsalva  2-D echo 10/21/2015 - Left ventricle: The cavity size was normal. Wall thickness was   normal. Systolic function was normal. The estimated ejection   fraction was in the range of 60% to 65%. Wall motion was normal;   there were no regional wall motion abnormalities.  Component     Latest Ref Rng 09/29/2014 09/30/2014  Opiates     NONE DETECTED NONE DETECTED   COCAINE     NONE DETECTED NONE DETECTED   Benzodiazepines     NONE DETECTED NONE DETECTED   Amphetamines     NONE DETECTED NONE DETECTED   Tetrahydrocannabinol     NONE DETECTED NONE DETECTED   Barbiturates     NONE DETECTED NONE DETECTED   Cholesterol     0 - 200 mg/dL  784  Triglycerides     <150 mg/dL  74  HDL Cholesterol     >  40 mg/dL  38 (L)  Total CHOL/HDL Ratio       3.7  VLDL     0 - 40 mg/dL  15  LDL (calc)     0 - 99 mg/dL  87  PTT Lupus Anticoagulant     0.0 - 50.0 sec 39.8   DRVVT     0.0 - 55.1 sec 35.1   Lupus Anticoag Interp      Comment:   Anticardiolipin IgG     0 - 14 GPL U/mL <9   Anticardiolipin IgM     0 - 12 MPL U/mL <9   Anticardiolipin IgA     0 - 11 APL U/mL <9   Beta-2 Glyco I IgG     0 - 20 GPI IgG units <9   Beta-2-Glycoprotein I IgM     0 - 32 GPI IgM units <9   Beta-2-Glycoprotein I IgA     0 - 25 GPI IgA units <9   Hemoglobin A1C     4.8 - 5.6 % 5.6   Mean Plasma Glucose      114   Recommendations-PTGENE:      Comment   Additional Information      Comment    Recommendations-F5LEID:      Comment   Comment      Comment   S cerevisiae IgG ab     <=20.0 U 9.2   S cerevisiae IgA ab     <=20.0 U 8.7   C3 Complement     82 - 167 mg/dL 098110   Complement C4, Body Fluid     14 - 44 mg/dL 28   Compl, Total ([id number])     42 - 60 U/mL 56   HIV     Non Reactive Non Reactive   Antithrombin Activity     75 - 120 % 93   Protein C Activity     74 - 151 % 149   Protein C, Total     70 - 140 % 80   Protein S Activity     60 - 145 % 131   Protein S Ag, Total     58 - 150 % 127   Homocysteine     0.0 - 15.0 umol/L 8.3   RPR     Non Reactive Non Reactive   Sed Rate     0 - 16 mm/hr 2   Rhuematoid fact SerPl-aCnc     0.0 - 13.9 IU/mL <7.0   ds DNA Ab      <1   SSA (Ro) (ENA) Antibody, IgG     <1.0 NEG AI <1.0 NEG   SSB (La) (ENA) Antibody, IgG     <1.0 NEG AI <1.0 NEG    Component     Latest Ref Rng 04/22/2015  Alpha-Galactosidase activity     28.0 - 80.0 nmol/hr/mg prt 84.0 (H)  Interpretation      Comment  Director Review      Comment  Methodology      Comment    Assessment: As you may recall, he is a 25 y.o. Caucasian male with PMH of albinism, congenital nystagmus and hypoglycemic reaction was admitted on 09/28/14 for transient dysarthria, left face numbness-weakness, left hand weakness. MRI showed right frontal linear shaped DWI changes, likely infarct. Extensive stroke work up done including, CTA head and neck, LE venous doppler, UDS, cerebral angio, TEE and hypercoagulable and autoimmune work up all negative except a very small PFO.  LDL 87 and A1C 5.6. He was put on ASA 81mg  and zocor and discharged in good condition. During the interval time, the patient has been doing well. No recurrent symptoms. He stopped celexa by himself in 02/2015. Check of alpha-galactosidase has ruled out fabry's disease. TCD emboli monitoring negative but TCD bubble study showed moderate PFO (spencer degree III at rest, IV with valsalva). Due to lack of stroke  risk factors, young age, active at baseline, no other positive findings, he was referred to Dr. Excell Seltzer for PFO closure which was done in 08/2015 without complication. He is on aspirin and Plavix for total 6 months with plan to repeat TTE and TCD bubble studies.  Plan:  - continue ASA 81 and plavix for total 6 months and then ASA alone - continue low dose zocor for stroke prevention - follow up Dr. Excell Seltzer to post PFO closure.  - Follow up with your primary care physician for stroke risk factor modification. Recommend maintain blood pressure goal <130/80, diabetes with hemoglobin A1c goal below 6.5% and lipids with LDL cholesterol goal below 70 mg/dL.  - repeat TCD bubble study in November. - follow up in 6 months.   No orders of the defined types were placed in this encounter.   Meds ordered this encounter  Medications  . simvastatin (ZOCOR) 10 MG tablet    Sig: Take 1 tablet (10 mg total) by mouth daily at 6 PM.    Dispense:  90 tablet    Refill:  3    Patient Instructions  - continue ASA 81 and plavix for another one month and then ASA alone - continue low dose zocor for stroke prevention - follow up Dr. Excell Seltzer to post PFO closure.  - Follow up with your primary care physician for stroke risk factor modification. Recommend maintain blood pressure goal <130/80, diabetes with hemoglobin A1c goal below 6.5% and lipids with LDL cholesterol goal below 70 mg/dL.  - call us back in November if you did not get call for TCD bubble study. - follow up in 6 months.   Marvel Plan, MD PhD [facility] [address], [address] [place], [place] [address] [phone]

## 2016-04-13 ENCOUNTER — Other Ambulatory Visit: Payer: Self-pay

## 2016-04-13 DIAGNOSIS — Q2112 Patent foramen ovale: Secondary | ICD-10-CM

## 2016-04-13 DIAGNOSIS — Q211 Atrial septal defect: Secondary | ICD-10-CM

## 2016-04-28 ENCOUNTER — Other Ambulatory Visit: Payer: Self-pay

## 2016-04-28 ENCOUNTER — Ambulatory Visit (HOSPITAL_COMMUNITY): Payer: Medicaid Other | Attending: Cardiology

## 2016-04-28 ENCOUNTER — Other Ambulatory Visit (HOSPITAL_COMMUNITY): Payer: Medicaid Other

## 2016-04-28 DIAGNOSIS — Q211 Atrial septal defect: Secondary | ICD-10-CM | POA: Insufficient documentation

## 2016-04-28 DIAGNOSIS — Q2112 Patent foramen ovale: Secondary | ICD-10-CM

## 2016-04-29 ENCOUNTER — Other Ambulatory Visit (HOSPITAL_COMMUNITY): Payer: Medicaid Other

## 2016-05-02 ENCOUNTER — Ambulatory Visit (INDEPENDENT_AMBULATORY_CARE_PROVIDER_SITE_OTHER): Payer: Medicaid Other | Admitting: Cardiovascular Disease

## 2016-05-02 ENCOUNTER — Encounter: Payer: Self-pay | Admitting: Cardiovascular Disease

## 2016-05-02 VITALS — BP 124/60 | HR 66 | Ht 73.0 in | Wt 188.4 lb

## 2016-05-02 DIAGNOSIS — E785 Hyperlipidemia, unspecified: Secondary | ICD-10-CM | POA: Diagnosis not present

## 2016-05-02 DIAGNOSIS — Q211 Atrial septal defect: Secondary | ICD-10-CM | POA: Diagnosis not present

## 2016-05-02 DIAGNOSIS — Q2112 Patent foramen ovale: Secondary | ICD-10-CM

## 2016-05-02 LAB — HEPATIC FUNCTION PANEL
ALBUMIN: 4.7 g/dL (ref 3.6–5.1)
ALK PHOS: 44 U/L (ref 40–115)
ALT: 10 U/L (ref 9–46)
AST: 14 U/L (ref 10–40)
BILIRUBIN TOTAL: 0.8 mg/dL (ref 0.2–1.2)
Bilirubin, Direct: 0.2 mg/dL (ref <=0.2)
Indirect Bilirubin: 0.6 mg/dL (ref 0.2–1.2)
Total Protein: 6.8 g/dL (ref 6.1–8.1)

## 2016-05-02 LAB — LIPID PANEL
CHOL/HDL RATIO: 2.6 ratio (ref <5.0)
Cholesterol: 134 mg/dL (ref <200)
HDL: 52 mg/dL (ref >40)
LDL CALC: 70 mg/dL (ref <100)
Triglycerides: 62 mg/dL (ref <150)
VLDL: 12 mg/dL (ref <30)

## 2016-05-02 NOTE — Patient Instructions (Signed)
Medication Instructions:  Your physician has recommended you make the following change in your medication:  1. STOP Plavix (clopidogrel), Do not stop Aspirin  Labwork: Your physician recommends that you have lab work today: LIPID and LIVER  Testing/Procedures: No new orders.   Follow-Up: Your physician wants you to follow-up in: 1 YEAR with Dr Excell Seltzerooper.  You will receive a reminder letter in the mail two months in advance. If you don't receive a letter, please call our office to schedule the follow-up appointment.   Any Other Special Instructions Will Be Listed Below (If Applicable).     If you need a refill on your cardiac medications before your next appointment, please call your pharmacy.

## 2016-05-02 NOTE — Progress Notes (Signed)
Cardiology Office Note Date:  05/02/2016   ID:  Jeffrey Signsory D Netto, DOB Aug 13, 1990, MRN [id number]  PCP:  Kaleen MaskELKINS,WILSON OLIVER, MD  Cardiologist:  Tonny Bollmanooper, Riyan Gavina, MD    Chief Complaint  Patient presents with  . PFO    s/p closure     History of Present Illness: Jeffrey Gilmore is a 25 y.o. male who presents for Follow-up after undergoing transcatheter PFO closure 08/28/2015. He was treated with a 25 mm Amplatzer PFO occluder. The patient is here with his mother today. He is doing very well. He has no cardiac complaints. He specifically denies chest pain, heart palpitations, edema, or shortness of breath. He's been compliant with a medical program that includes aspirin, clopidogrel, and simvastatin. He underwent an echocardiogram prior to his visit him and straining normal position of the PFO occluder with no intracardiac level shunt. There are very few late bubbles in the left heart potentially indicative of pulmonary AVMs.   Past Medical History:  Diagnosis Date  . Albinism (HCC)   . Hypoglycemic reaction   . Legally blind   . Nystagmus   . PFO (patent foramen ovale) 09/16/2015   a. s/p placement of 25 mm Amplatzer PFO occluder  . Stroke [facility](HCC)     Past Surgical History:  Procedure Laterality Date  . CARDIAC CATHETERIZATION N/A 09/16/2015   Procedure: ASD/VSD Closure;  Surgeon: Tonny BollmanMichael Doniqua Saxby, MD;  [facility];  Service: Cardiovascular;  Laterality: N/A;  . EYE SURGERY     1997, 2010  . TEE WITHOUT CARDIOVERSION N/A 09/30/2014   Procedure: TRANSESOPHAGEAL ECHOCARDIOGRAM (TEE);  Surgeon: Wendall StadePeter C Nishan, MD;  [facility];  Service: Cardiovascular;  Laterality: N/A;    Current Outpatient Prescriptions  Medication Sig Dispense Refill  . aspirin EC 81 MG tablet Take 81 mg by mouth daily.    . clopidogrel (PLAVIX) 75 MG tablet Take 1 tablet (75 mg total) by mouth daily. 30 tablet 6  . simvastatin (ZOCOR) 10 MG tablet Take 1 tablet (10 mg total) by mouth daily  at 6 PM. 90 tablet 3   No current facility-administered medications for this visit.     Allergies:   Amoxicillin and Penicillins   Social History:  The patient  reports that he has quit smoking. His smoking use included Pipe. He has never used smokeless tobacco. He reports that he drinks about 0.6 oz of alcohol per week . He reports that he does not use drugs.   Family History:  The patient's  family history includes Alcoholism in his maternal grandmother; Cirrhosis in his maternal grandmother; Diabetes in his paternal grandfather and paternal grandmother; Healthy in his father and mother; Hypertension in his paternal grandfather and paternal grandmother; Prostate cancer in his paternal grandfather.    ROS:  Please see the history of present illness. All other systems are reviewed and negative.    PHYSICAL EXAM: VS:  BP 124/60   Pulse 66   Ht 6\' 1"  (1.854 m)   Wt 188 lb 6.4 oz (85.5 kg)   BMI 24.86 kg/m  , BMI Body mass index is 24.86 kg/m. GEN: Well nourished, well developed, in no acute distress  HEENT: normal  Neck: no JVD, no masses. No carotid bruits Cardiac: RRR without murmur or gallop                Respiratory:  clear to auscultation bilaterally, normal work of breathing GI: soft, nontender, nondistended, + BS MS: no deformity or atrophy  Ext: no  pretibial edema, pedal pulses 2+= bilaterally Skin: warm and dry, no rash Neuro:  Strength and sensation are intact Psych: euthymic mood, full affect  EKG:  EKG is ordered today. The ekg ordered today shows normal sinus rhythm 66 bpm, within normal limits.  Recent Labs: 09/09/2015: BUN 13; Creat 1.04; Hemoglobin 15.6; Platelets 214; Potassium 4.7; Sodium 138   Lipid Panel     Component Value Date/Time   CHOL 140 09/30/2014 0607   TRIG 74 09/30/2014 0607   HDL 38 (L) 09/30/2014 0607   CHOLHDL 3.7 09/30/2014 0607   VLDL 15 09/30/2014 0607   LDLCALC 87 09/30/2014 0607      Wt Readings from Last 3 Encounters:    05/02/16 188 lb 6.4 oz (85.5 kg)  02/10/16 186 lb 12.8 oz (84.7 kg)  10/21/15 186 lb (84.4 kg)     Cardiac Studies Reviewed: 2D Echo 04-28-2016: Study Conclusions  - Left ventricle: The cavity size was normal. Wall thickness was   normal. Systolic function was normal. The estimated ejection   fraction was in the range of 55% to 60%. - Aortic valve: There was no stenosis. - Mitral valve: There was no regurgitation. - Right ventricle: The cavity size was normal. Systolic function   was normal. - Atrial septum: PFO closure device noted, no leak by color   doppler. Bubble study did not show any early crossing. A few   bubbles were seen late (likely from pulmonary AVMs). - Inferior vena cava: The vessel was normal in size. The   respirophasic diameter changes were in the normal range (>= 50%),   consistent with normal central venous pressure.  Impressions:  - Limited study post PFO closure.  ASSESSMENT AND PLAN: [age] year old male with history of cryptogenic stroke and PFO, now status post transcatheter closure. I personally reviewed his echo images. He had a very good echo bubble study where the right heart was completely opacified and there is no atrial level shunt identified. Recommend that he stop clopidogrel and continue aspirin 81 mg lifelong. Will update a lipid panel since he is taking simvastatin. The patient will follow-up in one year.  Current medicines are reviewed with the patient today.  The patient does not have concerns regarding medicines.  Labs/ tests ordered today include:  No orders of the defined types were placed in this encounter.  Disposition:   FU one year  Signed, Tonny Bollmanooper, Jasleen Riepe, MD  05/02/2016 8:41 AM    [facility] [address], [place], [place]  [address] Phone: [phone]; Fax: [phone]

## 2016-09-11 IMAGING — XA IR ANGIO INTRA EXTRACRAN SEL COM CAROTID INNOMINATE BILAT MOD SE
1 series · 13 of 24 positions shown · IV contrast (IODINE)
Comparison: none

CLINICAL DATA: Right cerebral hemispheric ischemic stroke.
Vasculitis.

[Series 300: neuro · 13 of 197 slices shown]
[im 1/197]
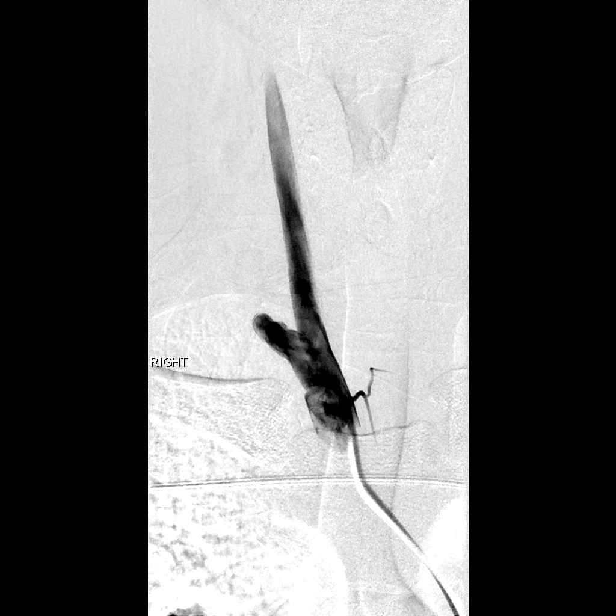
[im 18/197]
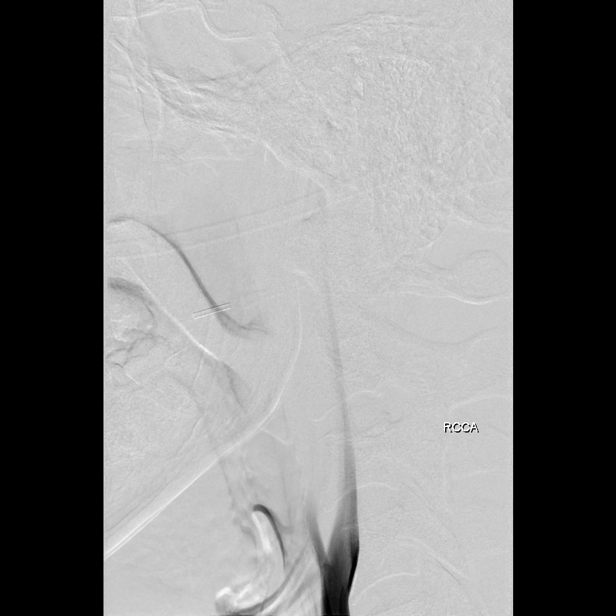
[im 35/197]
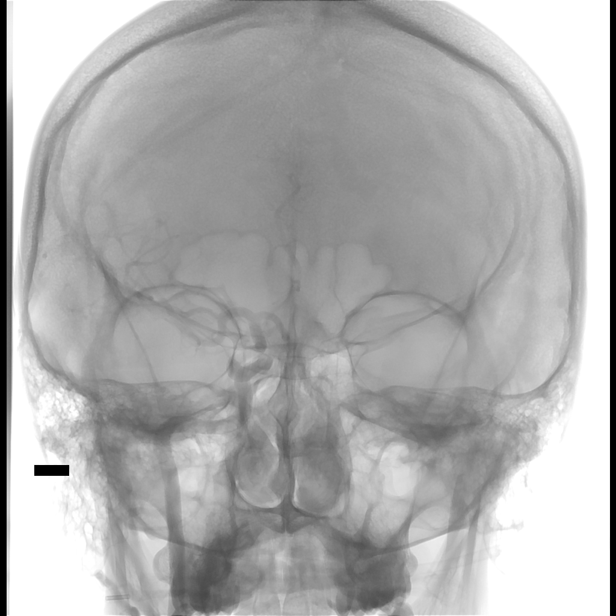
[im 52/197]
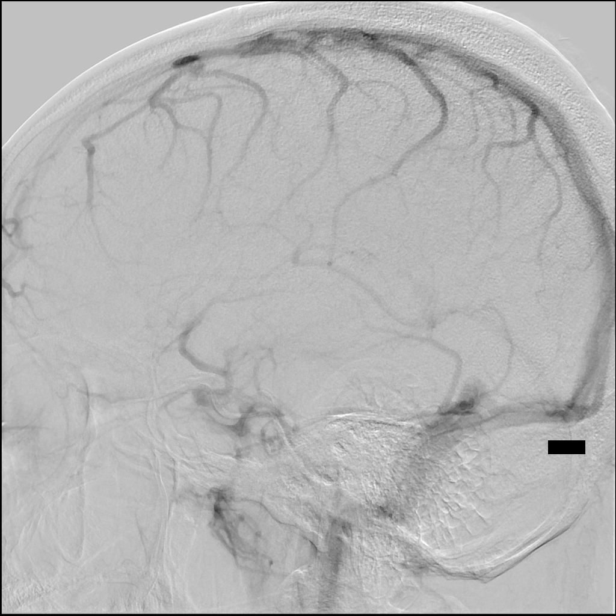
[im 69/197]
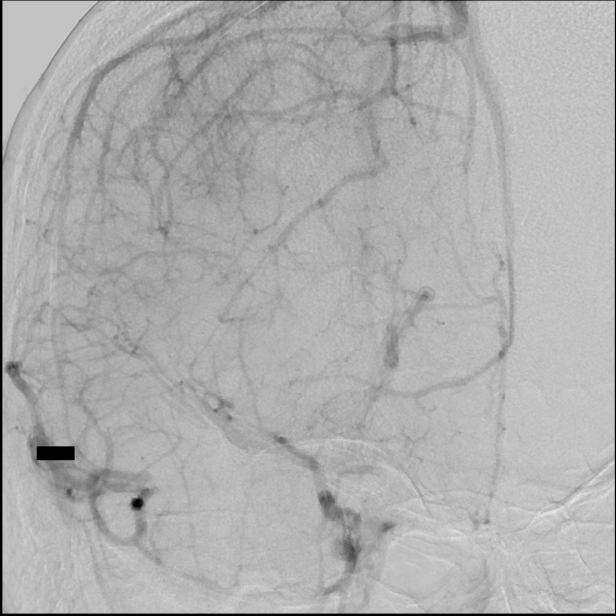
[im 86/197]
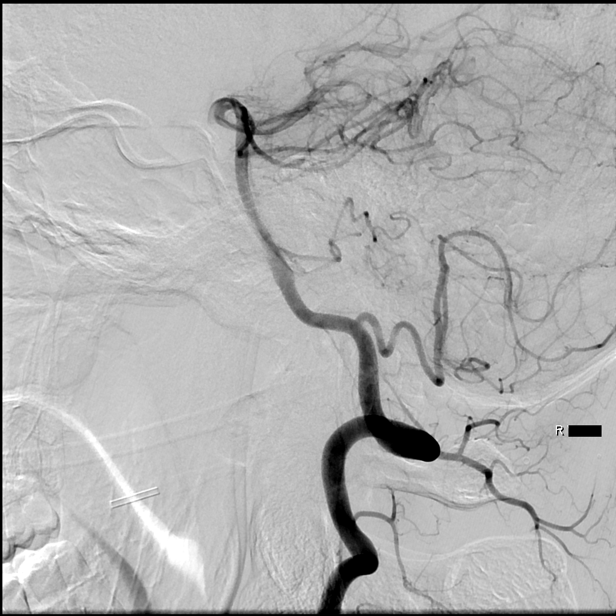
[im 103/197]
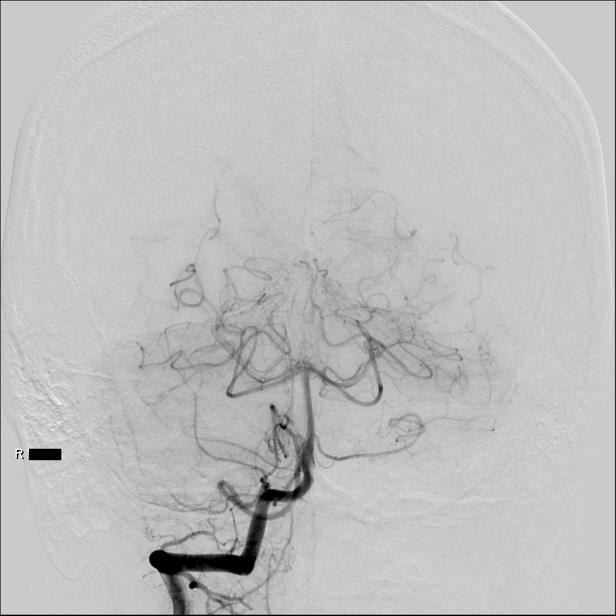
[im 111/197]
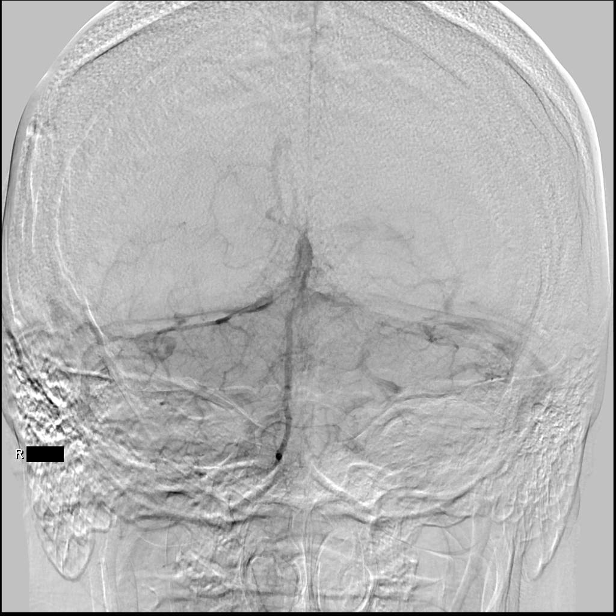
[im 128/197]
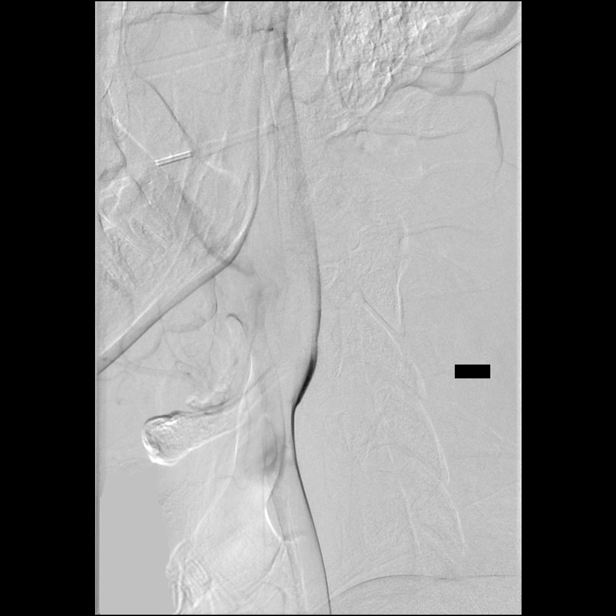
[im 145/197]
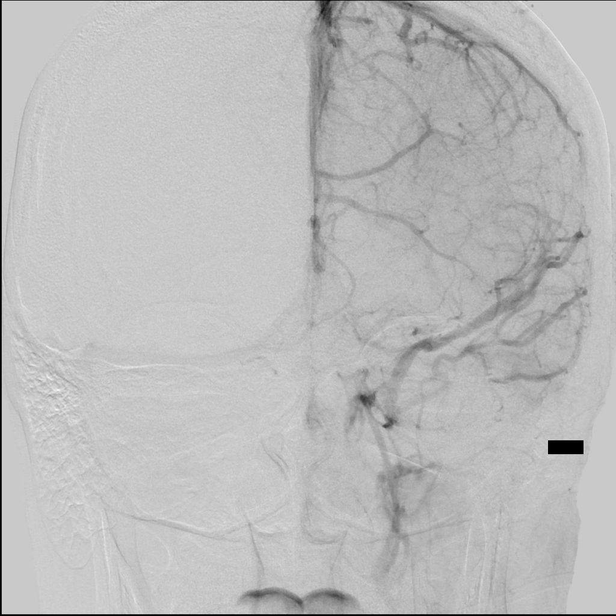
[im 162/197]
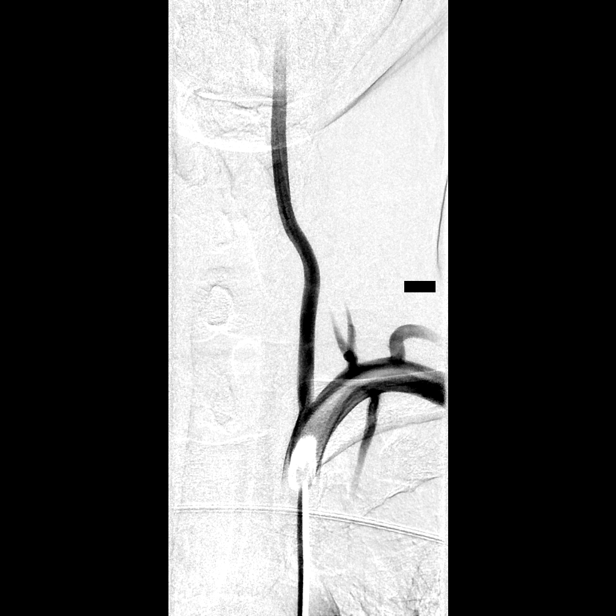
[im 179/197]
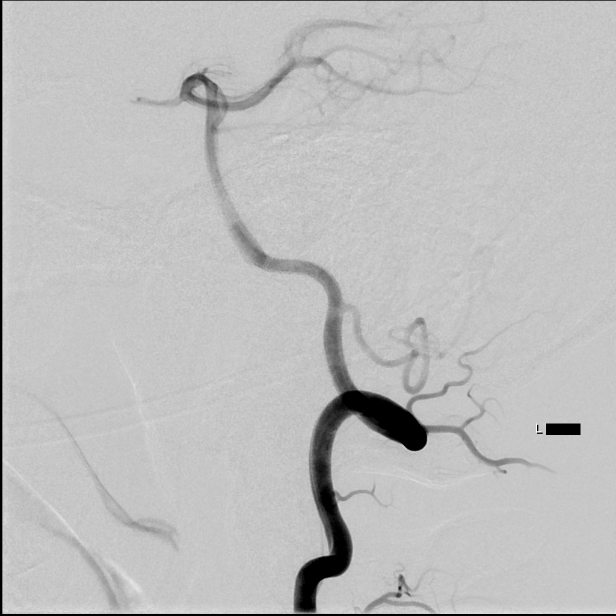
[im 197/197]
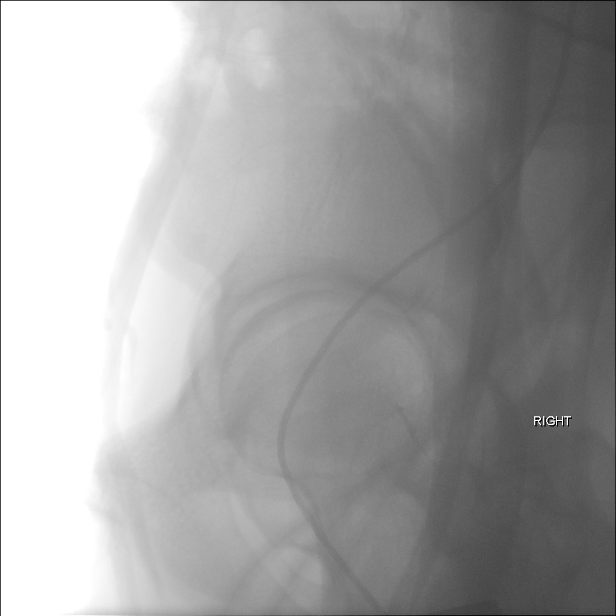

[13 of 24 positions shown; findings below may reference images not displayed]

EXAM:
BILATERAL COMMON CAROTID AND INNOMINATE ANGIOGRAPHY AND BILATERAL
VERTEBRAL ARTERY ANGIOGRAMS

PROCEDURE:
Contrast: 60 mL OMNIPAQUE IOHEXOL 300 MG/ML  SOLN

Anesthesia/Sedation:  Conscious sedation.

Medications: None.

Following a full explanation of the procedure along with the
potential associated complications, an informed witnessed consent
was obtained.

The right groin was prepped and draped in the usual sterile fashion.
Thereafter using modified Seldinger technique, transfemoral access
into the right common femoral artery was obtained without
difficulty. Over a 0.035 inch guidewire, a 5 French Pinnacle sheath
was inserted. Through this, and also over 0.035 inch guidewire, a 5
French JB1 catheter was advanced to the aortic arch region and
selectively positioned in the right common carotid artery, the right
vertebral artery, the left common carotid artery and the left
vertebral artery.

There were no acute complications. The patient tolerated the
procedure well.
FINDINGS: The right common carotid arteriogram demonstrates the right external
carotid artery and its major branches to be widely patent.

The right internal carotid artery at the bulb to the cranial skull
base opacifies normally.

The petrous, cavernous, and the supraclinoid segments are normally
opacified.

The right middle and the right anterior cerebral arteries opacify
into the capillary and venous phases. The right vertebral artery
origin is normal.

The vessel is seen to opacify normally to the cranial skull base.
Normal opacification is seen of the right posterior inferior
cerebellar artery and the right vertebrobasilar junction.

The opacified portions of the posterior cerebral arteries, the
superior cerebellar arteries and the anterior inferior cerebellar
arteries is normal into the capillary and venous phases.
Non-opacified blood is seen in the basilar artery and the posterior
cerebral arteries from the contralateral vertebral artery.

The left common carotid arteriogram demonstrates the left external
carotid artery and its major branches to be widely patent.

The left internal carotid artery at the bulb to cranial skull base
opacifies normally.

The petrous, cavernous, and the supraclinoid segments are widely
patent.

The left middle and the left anterior cerebral arteries opacify
normally into the capillary and venous phases.

The left vertebral artery origin is normal.

The vessel is seen to opacify normally to the cranial skull base.
Normal opacification is seen of the left vertebrobasilar junction
and the left posterior inferior cerebellar artery. The opacified
basilar artery, the posterior cerebral arteries, the superior
cerebellar arteries and the anterior inferior cerebellar arteries is
normal into the capillary and venous phases. Again unopacified blood
is seen in the basilar artery from the contralateral vertebral
artery.
IMPRESSION: Angiographically no evidence of occlusions, stenoses, dissections,
arteriovenous shunting, or of arteriovenous malformations or of
aneurysms noted intracranially.

Venous outflow within normal limits.

## 2017-06-15 ENCOUNTER — Emergency Department (HOSPITAL_COMMUNITY)
Admission: EM | Admit: 2017-06-15 | Discharge: 2017-06-16 | Disposition: A | Discharge status: Home / Self Care | Payer: Medicaid Other | Attending: Emergency Medicine | Admitting: Emergency Medicine

## 2017-06-15 ENCOUNTER — Other Ambulatory Visit: Payer: Self-pay

## 2017-06-15 ENCOUNTER — Encounter (HOSPITAL_COMMUNITY): Payer: Self-pay | Admitting: Emergency Medicine

## 2017-06-15 DIAGNOSIS — Y999 Unspecified external cause status: Secondary | ICD-10-CM | POA: Diagnosis not present

## 2017-06-15 DIAGNOSIS — Y929 Unspecified place or not applicable: Secondary | ICD-10-CM | POA: Insufficient documentation

## 2017-06-15 DIAGNOSIS — S40862A Insect bite (nonvenomous) of left upper arm, initial encounter: Secondary | ICD-10-CM | POA: Insufficient documentation

## 2017-06-15 DIAGNOSIS — W57XXXA Bitten or stung by nonvenomous insect and other nonvenomous arthropods, initial encounter: Secondary | ICD-10-CM | POA: Diagnosis not present

## 2017-06-15 DIAGNOSIS — Y939 Activity, unspecified: Secondary | ICD-10-CM | POA: Diagnosis not present

## 2017-06-15 DIAGNOSIS — L539 Erythematous condition, unspecified: Secondary | ICD-10-CM | POA: Diagnosis present

## 2017-06-15 MED ORDER — EPINEPHRINE 0.3 MG/0.3ML IJ SOAJ: 0.3000 mg | Freq: Once | INTRAMUSCULAR | 1 refills | Status: AC | PRN

## 2017-06-15 MED ORDER — DOXYCYCLINE HYCLATE 100 MG PO TABS
100.0000 mg | ORAL_TABLET | Freq: Once | ORAL | Status: AC
  Administered 2017-06-15: 100 mg via ORAL
  Filled 2017-06-15: qty 1

## 2017-06-15 MED ORDER — DOXYCYCLINE HYCLATE 100 MG PO CAPS: 100.0000 mg | ORAL_CAPSULE | Freq: Two times a day (BID) | ORAL | 0 refills | Status: DC

## 2017-06-15 NOTE — ED Triage Notes (Signed)
Pt thinks he may have got bitten by a spider  States he woke up with 3 red places noted on his left upper arm  States they stung at first but are now itchy

## 2017-06-15 NOTE — Discharge Instructions (Signed)
The marks on your arms look like bites of some kind.  Please monitor them closely.  Return for fever, ulceration of skin, or skin breakdown.    Dial 911 if you experience shortness of breath, throat swelling, or difficulty breathing.

## 2017-06-15 NOTE — ED Provider Notes (Signed)
[facility]-EMERGENCY DEPT Provider Note   CSN: [id number] Arrival date & time: 06/15/17  2147     History   Chief Complaint Chief Complaint  Patient presents with  . Insect Bite    HPI Jeffrey Gilmore is a 27 y.o. male.  Patient presents to the emergency department with chief complaint of insect bites.  States that he noticed several bites on his upper extremities today.  He states that he was moving out of a storage unit yesterday.  He reports that bites feel itchy.  He denies any fevers, chills, nausea, vomiting, shortness of breath, chest tightness, or throat swelling.  He has tried taking Benadryl with some relief.  He denies any other associated symptoms.  Denies any aggravating factors.   The history is provided by the patient. No language interpreter was used.    Past Medical History:  Diagnosis Date  . Albinism (HCC)   . Hypoglycemic reaction   . Legally blind   . Nystagmus   . PFO (patent foramen ovale) 09/16/2015   a. s/p placement of 25 mm Amplatzer PFO occluder  . Stroke [facility])     Patient Active Problem List   Diagnosis Date Noted  . PFO (patent foramen ovale) 09/16/2015  . 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 Albinism (HCC) 04/22/2015  . HLD (hyperlipidemia)   . CVA (cerebral infarction) 09/29/2014  . Stroke (HCC) 09/29/2014  . Numbness   . Marijuana abuse     Past Surgical History:  Procedure Laterality Date  . CARDIAC CATHETERIZATION N/A 09/16/2015   Procedure: ASD/VSD Closure;  Surgeon: Tonny Bollman, MD;  Location: [facility] INVASIVE CV LAB;  Service: Cardiovascular;  Laterality: N/A;  . EYE SURGERY     1997, 2010  . TEE WITHOUT CARDIOVERSION N/A 09/30/2014   Procedure: TRANSESOPHAGEAL ECHOCARDIOGRAM (TEE);  Surgeon: Wendall Stade, MD;  Location[facility] ENDOSCOPY;  Service: Cardiovascular;  Laterality: N/A;       Home Medications    Prior to Admission medications   Medication Sig Start Date End Date Taking? Authorizing Provider    aspirin EC 81 MG tablet Take 81 mg by mouth daily.    [provider]  simvastatin (ZOCOR) 10 MG tablet Take 1 tablet (10 mg total) by mouth daily at 6 PM. 02/10/16   Marvel Plan, MD    Family History Family History  Problem Relation Age of Onset  . Healthy Mother   . Healthy Father   . Diabetes Paternal Grandfather   . Prostate cancer Paternal Grandfather   . Hypertension Paternal Grandfather   . Diabetes Paternal Grandmother   . Hypertension Paternal Grandmother   . Cirrhosis Maternal Grandmother   . Alcoholism Maternal Grandmother     Social History Social History   Tobacco Use  . Smoking status: Former Smoker    Types: Pipe  . Smokeless tobacco: Never Used  . Tobacco comment: Hookah  Substance Use Topics  . Alcohol use: Yes    Alcohol/week: 0.6 oz    Types: 1 Cans of beer per week    Comment: Few beers per week.  . Drug use: No    Comment: Last used in May 2016.     Allergies   Amoxicillin and Penicillins   Review of Systems Review of Systems  All other systems reviewed and are negative.    Physical Exam Updated Vital Signs BP 129/83 (BP Location: Right Arm)   Pulse 66   Temp 98 F (36.7 C) (Oral)   Resp 18  SpO2 96%   Physical Exam  Constitutional: He is oriented to person, place, and time. He appears well-developed and well-nourished.  HENT:  Head: Normocephalic and atraumatic.  Eyes: Conjunctivae and EOM are normal. Pupils are equal, round, and reactive to light. Right eye exhibits no discharge. Left eye exhibits no discharge. No scleral icterus.  Neck: Normal range of motion. Neck supple. No JVD present.  Cardiovascular: Normal rate, regular rhythm and normal heart sounds. Exam reveals no gallop and no friction rub.  No murmur heard. Pulmonary/Chest: Effort normal and breath sounds normal. No respiratory distress. He has no wheezes. He has no rales. He exhibits no tenderness.  Abdominal: Soft. He exhibits no distension and no mass.  There is no tenderness. There is no rebound and no guarding.  Musculoskeletal: Normal range of motion. He exhibits no edema or tenderness.  Neurological: He is alert and oriented to person, place, and time.  Skin: Skin is warm and dry.  Scattered erythematous lesions on bilateral upper extremities  Psychiatric: He has a normal mood and affect. His behavior is normal. Judgment and thought content normal.  Nursing note and vitals reviewed.    ED Treatments / Results  Labs (all labs ordered are listed, but only abnormal results are displayed) Labs Reviewed - No data to display  EKG  EKG Interpretation None       Radiology No results found.  Procedures Procedures (including critical care time)  Medications Ordered in ED Medications  doxycycline (VIBRA-TABS) tablet 100 mg (not administered)     Initial Impression / Assessment and Plan / ED Course  I have reviewed the triage vital signs and the nursing notes.  Pertinent labs & imaging results that were available during my care of the patient were reviewed by me and considered in my medical decision making (see chart for details).     Patient with rash.  Looks consistent with bites.  Afebrile.  VSS.  NAD.  Will cover with doxy and recommend continuing benadryl.  Patient ask for refill of his epi pen.  I see no evidence of anaphylactic reaction.  He states that when he first noticed the bites he thought to check his epi pen and saw that it was expired.  Final Clinical Impressions(s) / ED Diagnoses   Final diagnoses:  Insect bite, initial encounter    ED Discharge Orders        Ordered    doxycycline (VIBRAMYCIN) 100 MG capsule  2 times daily     06/15/17 2340    EPINEPHrine (EPIPEN 2-PAK) 0.3 mg/0.3 mL IJ SOAJ injection  Once PRN     06/15/17 2341       Roxy HorsemanBrowning, Wilfrid Hyser, PA-C 06/15/17 2342    Pricilla LovelessGoldston, Scott, MD 06/16/17 [phone]

## 2018-01-25 ENCOUNTER — Encounter: Payer: Self-pay | Admitting: Cardiology

## 2018-02-09 ENCOUNTER — Encounter: Payer: Self-pay | Admitting: Cardiology

## 2018-02-09 ENCOUNTER — Ambulatory Visit: Payer: Medicaid Other | Admitting: Cardiology

## 2018-02-09 ENCOUNTER — Encounter (INDEPENDENT_AMBULATORY_CARE_PROVIDER_SITE_OTHER): Payer: Self-pay

## 2018-02-09 VITALS — BP 138/66 | HR 66 | Ht 73.0 in | Wt 208.2 lb

## 2018-02-09 DIAGNOSIS — Z8673 Personal history of transient ischemic attack (TIA), and cerebral infarction without residual deficits: Secondary | ICD-10-CM | POA: Insufficient documentation

## 2018-02-09 DIAGNOSIS — Z8774 Personal history of (corrected) congenital malformations of heart and circulatory system: Secondary | ICD-10-CM | POA: Diagnosis not present

## 2018-02-09 DIAGNOSIS — R0602 Shortness of breath: Secondary | ICD-10-CM

## 2018-02-09 NOTE — Patient Instructions (Addendum)
Medication Instructions: Your physician recommends that you continue on your current medications as directed. Please refer to the Current Medication list given to you today.   Labwork: TODAY: BMET & CBC  Procedures/Testing: Your physician has requested that you have an echocardiogram (Bubble study). Echocardiography is a painless test that uses sound waves to create images of your heart. It provides your doctor with information about the size and shape of your heart and how well your heart's chambers and valves are working. This procedure takes approximately one hour. There are no restrictions for this procedure.   Follow-Up: Your physician recommends that you schedule a follow-up appointment in: 3 months with Dr. Excell Seltzer    Any Additional Special Instructions Will Be Listed Below (If Applicable).   DASH Eating Plan DASH stands for "Dietary Approaches to Stop Hypertension." The DASH eating plan is a healthy eating plan that has been shown to reduce high blood pressure (hypertension). It may also reduce your risk for type 2 diabetes, heart disease, and stroke. The DASH eating plan may also help with weight loss. What are tips for following this plan? General guidelines  Avoid eating more than 2,300 mg (milligrams) of salt (sodium) a day. If you have hypertension, you may need to reduce your sodium intake to 1,500 mg a day.  Limit alcohol intake to no more than 1 drink a day for nonpregnant women and 2 drinks a day for men. One drink equals 12 oz of beer, 5 oz of wine, or 1 oz of hard liquor.  Work with your health care provider to maintain a healthy body weight or to lose weight. Ask what an ideal weight is for you.  Get at least 30 minutes of exercise that causes your heart to beat faster (aerobic exercise) most days of the week. Activities may include walking, swimming, or biking.  Work with your health care provider or diet and nutrition specialist (dietitian) to adjust your eating  plan to your individual calorie needs. Reading food labels  Check food labels for the amount of sodium per serving. Choose foods with less than 5 percent of the Daily Value of sodium. Generally, foods with less than 300 mg of sodium per serving fit into this eating plan.  To find whole grains, look for the word "whole" as the first word in the ingredient list. Shopping  Buy products labeled as "low-sodium" or "no salt added."  Buy fresh foods. Avoid canned foods and premade or frozen meals. Cooking  Avoid adding salt when cooking. Use salt-free seasonings or herbs instead of table salt or sea salt. Check with your health care provider or pharmacist before using salt substitutes.  Do not fry foods. Cook foods using healthy methods such as baking, boiling, grilling, and broiling instead.  Cook with heart-healthy oils, such as olive, canola, soybean, or sunflower oil. Meal planning   Eat a balanced diet that includes: ? 5 or more servings of fruits and vegetables each day. At each meal, try to fill half of your plate with fruits and vegetables. ? Up to 6-8 servings of whole grains each day. ? Less than 6 oz of lean meat, poultry, or fish each day. A 3-oz serving of meat is about the same size as a deck of cards. One egg equals 1 oz. ? 2 servings of low-fat dairy each day. ? A serving of nuts, seeds, or beans 5 times each week. ? Heart-healthy fats. Healthy fats called Omega-3 fatty acids are found in foods such as flaxseeds  and coldwater fish, like sardines, salmon, and mackerel.  Limit how much you eat of the following: ? Canned or prepackaged foods. ? Food that is high in trans fat, such as fried foods. ? Food that is high in saturated fat, such as fatty meat. ? Sweets, desserts, sugary drinks, and other foods with added sugar. ? Full-fat dairy products.  Do not salt foods before eating.  Try to eat at least 2 vegetarian meals each week.  Eat more home-cooked food and less  restaurant, buffet, and fast food.  When eating at a restaurant, ask that your food be prepared with less salt or no salt, if possible. What foods are recommended? The items listed may not be a complete list. Talk with your dietitian about what dietary choices are best for you. Grains Whole-grain or whole-wheat bread. Whole-grain or whole-wheat pasta. Brown rice. Orpah Cobb. Bulgur. Whole-grain and low-sodium cereals. Pita bread. Low-fat, low-sodium crackers. Whole-wheat flour tortillas. Vegetables Fresh or frozen vegetables (raw, steamed, roasted, or grilled). Low-sodium or reduced-sodium tomato and vegetable juice. Low-sodium or reduced-sodium tomato sauce and tomato paste. Low-sodium or reduced-sodium canned vegetables. Fruits All fresh, dried, or frozen fruit. Canned fruit in natural juice (without added sugar). Meat and other protein foods Skinless chicken or [place]. Ground chicken or [place]. Pork with fat trimmed off. Fish and seafood. Egg whites. Dried beans, peas, or lentils. Unsalted nuts, nut butters, and seeds. Unsalted canned beans. Lean cuts of beef with fat trimmed off. Low-sodium, lean deli meat. Dairy Low-fat (1%) or fat-free (skim) milk. Fat-free, low-fat, or reduced-fat cheeses. Nonfat, low-sodium ricotta or cottage cheese. Low-fat or nonfat yogurt. Low-fat, low-sodium cheese. Fats and oils Soft margarine without trans fats. Vegetable oil. Low-fat, reduced-fat, or light mayonnaise and salad dressings (reduced-sodium). Canola, safflower, olive, soybean, and sunflower oils. Avocado. Seasoning and other foods Herbs. Spices. Seasoning mixes without salt. Unsalted popcorn and pretzels. Fat-free sweets. What foods are not recommended? The items listed may not be a complete list. Talk with your dietitian about what dietary choices are best for you. Grains Baked goods made with fat, such as croissants, muffins, or some breads. Dry pasta or rice meal packs. Vegetables Creamed or  fried vegetables. Vegetables in a cheese sauce. Regular canned vegetables (not low-sodium or reduced-sodium). Regular canned tomato sauce and paste (not low-sodium or reduced-sodium). Regular tomato and vegetable juice (not low-sodium or reduced-sodium). Rosita Fire. Olives. Fruits Canned fruit in a light or heavy syrup. Fried fruit. Fruit in cream or butter sauce. Meat and other protein foods Fatty cuts of meat. Ribs. Fried meat. Tomasa Blase. Sausage. Bologna and other processed lunch meats. Salami. Fatback. Hotdogs. Bratwurst. Salted nuts and seeds. Canned beans with added salt. Canned or smoked fish. Whole eggs or egg yolks. Chicken or [place] with skin. Dairy Whole or 2% milk, cream, and half-and-half. Whole or full-fat cream cheese. Whole-fat or sweetened yogurt. Full-fat cheese. Nondairy creamers. Whipped toppings. Processed cheese and cheese spreads. Fats and oils Butter. Stick margarine. Lard. Shortening. Ghee. Bacon fat. Tropical oils, such as coconut, palm kernel, or palm oil. Seasoning and other foods Salted popcorn and pretzels. Onion salt, garlic salt, seasoned salt, table salt, and sea salt. Worcestershire sauce. Tartar sauce. Barbecue sauce. Teriyaki sauce. Soy sauce, including reduced-sodium. Steak sauce. Canned and packaged gravies. Fish sauce. Oyster sauce. Cocktail sauce. Horseradish that you find on the shelf. Ketchup. Mustard. Meat flavorings and tenderizers. Bouillon cubes. Hot sauce and Tabasco sauce. Premade or packaged marinades. Premade or packaged taco seasonings. Relishes. Regular salad dressings. Where to  find more information:  National Heart, Lung, and Blood Institute: PopSte[employer].iswww.nhlbi.nih.gov  American Heart Association: www.heart.org Summary  The DASH eating plan is a healthy eating plan that has been shown to reduce high blood pressure (hypertension). It may also reduce your risk for type 2 diabetes, heart disease, and stroke.  With the DASH eating plan, you should limit salt  (sodium) intake to 2,300 mg a day. If you have hypertension, you may need to reduce your sodium intake to 1,500 mg a day.  When on the DASH eating plan, aim to eat more fresh fruits and vegetables, whole grains, lean proteins, low-fat dairy, and heart-healthy fats.  Work with your health care provider or diet and nutrition specialist (dietitian) to adjust your eating plan to your individual calorie needs. This information is not intended to replace advice given to you by your health care provider. Make sure you discuss any questions you have with your health care provider. Document Released: 04/28/2011 Document Revised: 05/02/2016 Document Reviewed: 05/02/2016 [employer] Interactive Patient Education  [employer]2018 [employer]    If you need a refill on your cardiac medications before your next appointment, please call your pharmacy.

## 2018-02-09 NOTE — Progress Notes (Signed)
Cardiology Office Note:    Date:  02/09/2018   ID:  Jeffrey Gilmore, DOB 04-15-1991, MRN [id number]  PCP:  Kaleen MaskElkins, Wilson Oliver, MD  Cardiologist:  Tonny BollmanMichael Cooper, MD  Referring MD: Kaleen MaskElkins, Wilson Oliver, *   Chief Complaint  Patient presents with  . Follow-up    PFO closure  . Shortness of Breath    History of Present Illness:    Jeffrey Gilmore is a 27 y.o. male with a past medical history significant for cryptogenic stroke and PFO S/P transcatheter PFO closure 08/28/2015, legally blind, nystagmus, albinism.  He was last seen on 05/02/2016 by Dr. Excell Seltzerooper at which time he was stable from a cardiac standpoint.  Per Dr. Excell Seltzerooper- He underwent an echocardiogram prior to his visit showing normal position of the PFO occluder with no intracardiac level shunt. There are very few late bubbles in the left heart potentially indicative of pulmonary AVMs.  His clopidogrel was stopped and he was continued on aspirin 81 mg lifelong.  He was on simvastatin for about a year but it was stopped in 2017.  He is here today for acute issue with his girlfriend. 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 He walks a lot at work but does not do formal exercise. He feels that he is still having exertional chest tightness when walking up 3 flights of stairs or doing more exertion like playing basketball.   He had some chest tightness, shortness of breath and felt like he was going to pass out when he was helping his mother move on 8/1. It was really hot that day. He has noticed this occurs also when he is playing basketball or swimming under water or when he eats too much.  He admits that he has a poor diet, eating a lot of fast food and no vegetables according to his girlfriend and rare fruit.  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    Past Medical History:  Diagnosis Date  . Albinism (HCC)   . Hypoglycemic reaction   . Legally blind   . Nystagmus   . PFO (patent  foramen ovale) 09/16/2015   a. s/p placement of 25 mm Amplatzer PFO occluder  . Stroke [facility](HCC)     Past Surgical History:  Procedure Laterality Date  . CARDIAC CATHETERIZATION N/A 09/16/2015   Procedure: ASD/VSD Closure;  Surgeon: Tonny BollmanMichael Cooper, MD;  [facility];  Service: Cardiovascular;  Laterality: N/A;  . EYE SURGERY     1997, 2010  . TEE WITHOUT CARDIOVERSION N/A 09/30/2014   Procedure: TRANSESOPHAGEAL ECHOCARDIOGRAM (TEE);  Surgeon: Wendall StadePeter C Nishan, MD;  [facility];  Service: Cardiovascular;  Laterality: N/A;    Current Medications: Current Meds  Medication Sig  . aspirin EC 81 MG tablet Take 81 mg by mouth daily.  Marland Kitchen. EPINEPHrine (EPIPEN 2-PAK) 0.3 mg/0.3 mL IJ SOAJ injection Inject 0.3 mLs (0.3 mg total) into the muscle once as needed for up to 1 dose (for severe allergic reaction). CAll 911 immediately if you have to use this medicine     Allergies:   Amoxicillin and Penicillins   Social History   Socioeconomic History  . Marital status: Single    Spouse name: Not on file  . Number of children: 0  . Years of education: 3312  . Highest education level: Not on file  Occupational History  . Occupation: Cook  . Occupation: International aid/development workerAssistant Manager  Social Needs  . Financial resource strain: Not on file  . Food insecurity:    Worry: Not on file    Inability: Not on file  . Transportation needs:    Medical: Not on file    Non-medical: Not on file  Tobacco Use  . Smoking status: Former Smoker    Types: Pipe  . Smokeless tobacco: Never Used  . Tobacco comment: Hookah  Substance and Sexual Activity  . Alcohol use: Yes    Alcohol/week: 1.0 standard drinks    Types: 1 Cans of beer per week    Comment: Few beers per week.  . Drug use: No    Types: Marijuana    Comment: Last used in May 2016.  Marland Kitchen Sexual activity: Yes    Birth control/protection: None  Lifestyle  . Physical activity:    Days per week: Not on file    Minutes per session: Not on file    . Stress: Not on file  Relationships  . Social connections:    Talks on phone: Not on file    Gets together: Not on file    Attends religious service: Not on file    Active member of club or organization: Not on file    Attends meetings of clubs or organizations: Not on file    Relationship status: Not on file  Other Topics Concern  . Not on file  Social History Narrative   Lives at home with his brother.   Left-handed.   1 cup caffeine per day.     Family History: The patient's family history includes Alcoholism in his maternal grandmother; Cirrhosis in his maternal grandmother; Diabetes in his paternal grandfather and paternal grandmother; Healthy in his father and mother; Hypertension in his paternal grandfather and paternal grandmother; Prostate cancer in his paternal grandfather. ROS:   Please see the history of present illness.     All other systems reviewed and are negative.  EKGs/Labs/Other Studies Reviewed:    The following studies were reviewed today:  Echo with bubble study 04/28/2016 Study Conclusions - Left ventricle: The cavity size was normal. Wall thickness was   normal. Systolic function was normal. The estimated ejection   fraction was in the range of 55% to 60%. - Aortic valve: There was no stenosis. - Mitral valve: There was no regurgitation. - Right ventricle: The cavity size was normal. Systolic function   was normal. - Atrial septum: PFO closure device noted, no leak by color   doppler. Bubble study did not show any early crossing. A few   bubbles were seen late (likely from pulmonary AVMs). - Inferior vena cava: The vessel was normal in size. The   respirophasic diameter changes were in the normal range (>= 50%),   consistent with normal central venous pressure.  Impressions: - Limited study post PFO closure.  EKG:  EKG is ordered today.  The ekg ordered today demonstrates NSR with poss LAE, 65 bpm, QTC 386  Recent Labs: No results found for  requested labs within last 8760 hours.   Recent Lipid Panel    Component Value Date/Time   CHOL 134 05/02/2016 0908   TRIG 62 05/02/2016 0908   HDL 52 05/02/2016 0908   CHOLHDL 2.6 05/02/2016 0908   VLDL 12 05/02/2016 0908   LDLCALC 70 05/02/2016 0908    Physical Exam:    VS:  BP 138/66   Pulse 66   Ht 6\' 1"  (1.854 m)   Wt 208 lb 4 oz (94.5  kg)   SpO2 98%   BMI 27.48 kg/m     Wt Readings from Last 3 Encounters:  02/09/18 208 lb 4 oz (94.5 kg)  05/02/16 188 lb 6.4 oz (85.5 kg)  02/10/16 186 lb 12.8 oz (84.7 kg)     Physical Exam  Constitutional: He is oriented to person, place, and time. He appears well-developed and well-nourished. No distress.  HENT:  Head: Normocephalic and atraumatic.  Nystagmus  Neck: Normal range of motion. Neck supple. No JVD present.  Cardiovascular: Normal rate, regular rhythm, normal heart sounds and intact distal pulses. Exam reveals no gallop.  No murmur heard. Pulmonary/Chest: Effort normal and breath sounds normal. No respiratory distress. He has no wheezes. He has no rales.  Abdominal: Soft. Bowel sounds are normal.  Musculoskeletal: Normal range of motion. He exhibits no edema or deformity.  Neurological: He is alert and oriented to person, place, and time.  Skin: Skin is warm and dry.  Psychiatric: He has a normal mood and affect. His behavior is normal. Judgment and thought content normal.  Vitals reviewed.   ASSESSMENT:    1. S/P patent foramen ovale closure   2. History of stroke   3. SOB (shortness of breath)    PLAN:    In order of problems listed above:  S/P PFO closure 08/28/2015: Pt complains of chest tightness and shortness of breath with more intense exertion like playing basketball, swimming under water or helping his mom move with lifting a lot. At his age he should not have any activity intolerance but his symptoms are rather vague. He does not appear volume overloaded. I will check an echo with bubble study to follow  up on his PFO closure. He is under a lot of stress and eating only fast food. We discussed taking better care of himself which he seems reluctant to do but his girlfriend says that she will help him.   He has not had any labs in over 2 years. I doubt he is anemic but will check a CBC and metabolic panel.   I will route this to Dr. Excell Seltzer for any further input.    Medication Adjustments/Labs and Tests Ordered: Current medicines are reviewed at length with the patient today.  Concerns regarding medicines are outlined above. Labs and tests ordered and medication changes are outlined in the patient instructions below:  Patient Instructions  Medication Instructions: Your physician recommends that you continue on your current medications as directed. Please refer to the Current Medication list given to you today.   Labwork: TODAY: BMET & CBC  Procedures/Testing: Your physician has requested that you have an echocardiogram (Bubble study). Echocardiography is a painless test that uses sound waves to create images of your heart. It provides your doctor with information about the size and shape of your heart and how well your heart's chambers and valves are working. This procedure takes approximately one hour. There are no restrictions for this procedure.   Follow-Up: Your physician recommends that you schedule a follow-up appointment in: 3 months with Dr. Excell Seltzer    Any Additional Special Instructions Will Be Listed Below (If Applicable).   DASH Eating Plan DASH stands for "Dietary Approaches to Stop Hypertension." The DASH eating plan is a healthy eating plan that has been shown to reduce high blood pressure (hypertension). It may also reduce your risk for type 2 diabetes, heart disease, and stroke. The DASH eating plan may also help with weight loss. What are tips for following this plan?  General guidelines  Avoid eating more than 2,300 mg (milligrams) of salt (sodium) a day. If you have  hypertension, you may need to reduce your sodium intake to 1,500 mg a day.  Limit alcohol intake to no more than 1 drink a day for nonpregnant women and 2 drinks a day for men. One drink equals 12 oz of beer, 5 oz of wine, or 1 oz of hard liquor.  Work with your health care provider to maintain a healthy body weight or to lose weight. Ask what an ideal weight is for you.  Get at least 30 minutes of exercise that causes your heart to beat faster (aerobic exercise) most days of the week. Activities may include walking, swimming, or biking.  Work with your health care provider or diet and nutrition specialist (dietitian) to adjust your eating plan to your individual calorie needs. Reading food labels  Check food labels for the amount of sodium per serving. Choose foods with less than 5 percent of the Daily Value of sodium. Generally, foods with less than 300 mg of sodium per serving fit into this eating plan.  To find whole grains, look for the word "whole" as the first word in the ingredient list. Shopping  Buy products labeled as "low-sodium" or "no salt added."  Buy fresh foods. Avoid canned foods and premade or frozen meals. Cooking  Avoid adding salt when cooking. Use salt-free seasonings or herbs instead of table salt or sea salt. Check with your health care provider or pharmacist before using salt substitutes.  Do not fry foods. Cook foods using healthy methods such as baking, boiling, grilling, and broiling instead.  Cook with heart-healthy oils, such as olive, canola, soybean, or sunflower oil. Meal planning   Eat a balanced diet that includes: ? 5 or more servings of fruits and vegetables each day. At each meal, try to fill half of your plate with fruits and vegetables. ? Up to 6-8 servings of whole grains each day. ? Less than 6 oz of lean meat, poultry, or fish each day. A 3-oz serving of meat is about the same size as a deck of cards. One egg equals 1 oz. ? 2 servings of  low-fat dairy each day. ? A serving of nuts, seeds, or beans 5 times each week. ? Heart-healthy fats. Healthy fats called Omega-3 fatty acids are found in foods such as flaxseeds and coldwater fish, like sardines, salmon, and mackerel.  Limit how much you eat of the following: ? Canned or prepackaged foods. ? Food that is high in trans fat, such as fried foods. ? Food that is high in saturated fat, such as fatty meat. ? Sweets, desserts, sugary drinks, and other foods with added sugar. ? Full-fat dairy products.  Do not salt foods before eating.  Try to eat at least 2 vegetarian meals each week.  Eat more home-cooked food and less restaurant, buffet, and fast food.  When eating at a restaurant, ask that your food be prepared with less salt or no salt, if possible. What foods are recommended? The items listed may not be a complete list. Talk with your dietitian about what dietary choices are best for you. Grains Whole-grain or whole-wheat bread. Whole-grain or whole-wheat pasta. Brown rice. Orpah Cobb. Bulgur. Whole-grain and low-sodium cereals. Pita bread. Low-fat, low-sodium crackers. Whole-wheat flour tortillas. Vegetables Fresh or frozen vegetables (raw, steamed, roasted, or grilled). Low-sodium or reduced-sodium tomato and vegetable juice. Low-sodium or reduced-sodium tomato sauce and tomato paste. Low-sodium or reduced-sodium  canned vegetables. Fruits All fresh, dried, or frozen fruit. Canned fruit in natural juice (without added sugar). Meat and other protein foods Skinless chicken or [place]. Ground chicken or [place]. Pork with fat trimmed off. Fish and seafood. Egg whites. Dried beans, peas, or lentils. Unsalted nuts, nut butters, and seeds. Unsalted canned beans. Lean cuts of beef with fat trimmed off. Low-sodium, lean deli meat. Dairy Low-fat (1%) or fat-free (skim) milk. Fat-free, low-fat, or reduced-fat cheeses. Nonfat, low-sodium ricotta or cottage cheese. Low-fat or  nonfat yogurt. Low-fat, low-sodium cheese. Fats and oils Soft margarine without trans fats. Vegetable oil. Low-fat, reduced-fat, or light mayonnaise and salad dressings (reduced-sodium). Canola, safflower, olive, soybean, and sunflower oils. Avocado. Seasoning and other foods Herbs. Spices. Seasoning mixes without salt. Unsalted popcorn and pretzels. Fat-free sweets. What foods are not recommended? The items listed may not be a complete list. Talk with your dietitian about what dietary choices are best for you. Grains Baked goods made with fat, such as croissants, muffins, or some breads. Dry pasta or rice meal packs. Vegetables Creamed or fried vegetables. Vegetables in a cheese sauce. Regular canned vegetables (not low-sodium or reduced-sodium). Regular canned tomato sauce and paste (not low-sodium or reduced-sodium). Regular tomato and vegetable juice (not low-sodium or reduced-sodium). Rosita Fire. Olives. Fruits Canned fruit in a light or heavy syrup. Fried fruit. Fruit in cream or butter sauce. Meat and other protein foods Fatty cuts of meat. Ribs. Fried meat. Tomasa Blase. Sausage. Bologna and other processed lunch meats. Salami. Fatback. Hotdogs. Bratwurst. Salted nuts and seeds. Canned beans with added salt. Canned or smoked fish. Whole eggs or egg yolks. Chicken or [place] with skin. Dairy Whole or 2% milk, cream, and half-and-half. Whole or full-fat cream cheese. Whole-fat or sweetened yogurt. Full-fat cheese. Nondairy creamers. Whipped toppings. Processed cheese and cheese spreads. Fats and oils Butter. Stick margarine. Lard. Shortening. Ghee. Bacon fat. Tropical oils, such as coconut, palm kernel, or palm oil. Seasoning and other foods Salted popcorn and pretzels. Onion salt, garlic salt, seasoned salt, table salt, and sea salt. Worcestershire sauce. Tartar sauce. Barbecue sauce. Teriyaki sauce. Soy sauce, including reduced-sodium. Steak sauce. Canned and packaged gravies. Fish sauce. Oyster  sauce. Cocktail sauce. Horseradish that you find on the shelf. Ketchup. Mustard. Meat flavorings and tenderizers. Bouillon cubes. Hot sauce and Tabasco sauce. Premade or packaged marinades. Premade or packaged taco seasonings. Relishes. Regular salad dressings. Where to find more information:  National Heart, Lung, and Blood Institute: PopSteam.is  American Heart Association: www.heart.org Summary  The DASH eating plan is a healthy eating plan that has been shown to reduce high blood pressure (hypertension). It may also reduce your risk for type 2 diabetes, heart disease, and stroke.  With the DASH eating plan, you should limit salt (sodium) intake to 2,300 mg a day. If you have hypertension, you may need to reduce your sodium intake to 1,500 mg a day.  When on the DASH eating plan, aim to eat more fresh fruits and vegetables, whole grains, lean proteins, low-fat dairy, and heart-healthy fats.  Work with your health care provider or diet and nutrition specialist (dietitian) to adjust your eating plan to your individual calorie needs. This information is not intended to replace advice given to you by your health care provider. Make sure you discuss any questions you have with your health care provider. Document Released: 04/28/2011 Document Revised: 05/02/2016 Document Reviewed: 05/02/2016 Elsevier Interactive Patient Education  [employer].    If you need a refill on your cardiac medications  before your next appointment, please call your pharmacy.      Signed, Berton Bon, NP  02/09/2018 6:30 PM    [facility]

## 2018-02-10 LAB — BASIC METABOLIC PANEL
BUN / CREAT RATIO: 12 (ref 9–20)
BUN: 11 mg/dL (ref 6–20)
CO2: 23 mmol/L (ref 20–29)
CREATININE: 0.94 mg/dL (ref 0.76–1.27)
Calcium: 9.7 mg/dL (ref 8.7–10.2)
Chloride: 99 mmol/L (ref 96–106)
GFR calc Af Amer: 129 mL/min/{1.73_m2} (ref >59)
GFR calc non Af Amer: 111 mL/min/{1.73_m2} (ref >59)
GLUCOSE: 74 mg/dL (ref 65–99)
Potassium: 4.2 mmol/L (ref 3.5–5.2)
SODIUM: 138 mmol/L (ref 134–144)

## 2018-02-10 LAB — CBC
Hematocrit: 42.8 % (ref 37.5–51.0)
Hemoglobin: 14.5 g/dL (ref 13.0–17.7)
MCH: 31.1 pg (ref 26.6–33.0)
MCHC: 33.9 g/dL (ref 31.5–35.7)
MCV: 92 fL (ref 79–97)
PLATELETS: 270 10*3/uL (ref 150–450)
RBC: 4.66 x10E6/uL (ref 4.14–5.80)
RDW: 13.5 % (ref 12.3–15.4)
WBC: 6.9 10*3/uL (ref 3.4–10.8)

## 2018-02-12 NOTE — Addendum Note (Signed)
Addended by: Vernard GamblesOLE, Brittiney Dicostanzo S on: 02/12/2018 11:53 AM   Modules accepted: Orders

## 2018-02-15 ENCOUNTER — Other Ambulatory Visit: Payer: Self-pay

## 2018-02-15 ENCOUNTER — Ambulatory Visit (HOSPITAL_COMMUNITY): Payer: Medicaid Other | Attending: Cardiovascular Disease

## 2018-02-15 DIAGNOSIS — Z8673 Personal history of transient ischemic attack (TIA), and cerebral infarction without residual deficits: Secondary | ICD-10-CM | POA: Diagnosis not present

## 2018-02-15 DIAGNOSIS — R0602 Shortness of breath: Secondary | ICD-10-CM | POA: Diagnosis not present

## 2018-05-22 ENCOUNTER — Encounter

## 2018-07-31 ENCOUNTER — Encounter (HOSPITAL_COMMUNITY): Payer: Self-pay | Admitting: *Deleted

## 2018-07-31 ENCOUNTER — Emergency Department (HOSPITAL_COMMUNITY)
Admission: EM | Admit: 2018-07-31 | Discharge: 2018-07-31 | Disposition: A | Discharge status: Home / Self Care | Payer: Medicaid Other | Attending: Emergency Medicine | Admitting: Emergency Medicine

## 2018-07-31 DIAGNOSIS — Z79899 Other long term (current) drug therapy: Secondary | ICD-10-CM | POA: Diagnosis not present

## 2018-07-31 DIAGNOSIS — R040 Epistaxis: Secondary | ICD-10-CM | POA: Insufficient documentation

## 2018-07-31 DIAGNOSIS — Z7982 Long term (current) use of aspirin: Secondary | ICD-10-CM | POA: Insufficient documentation

## 2018-07-31 DIAGNOSIS — Z87891 Personal history of nicotine dependence: Secondary | ICD-10-CM | POA: Insufficient documentation

## 2018-07-31 NOTE — ED Triage Notes (Signed)
Pt in c/o nose bleed lasting about 5 mins today in the shower, since bleeding has subsided, reports some dizziness at  The time, pt takes daily 81 mg ASA today, A&O x4

## 2018-07-31 NOTE — Discharge Instructions (Addendum)
Please read attached information. If you experience any new or worsening signs or symptoms please return to the emergency room for evaluation. Please follow-up with your primary care provider or specialist as discussed.  °

## 2018-07-31 NOTE — ED Provider Notes (Signed)
[facility] EMERGENCY DEPARTMENT Provider Note   CSN: [id number] Arrival date & time: 07/31/18  4098    History   Chief Complaint Chief Complaint  Patient presents with  . Epistaxis    HPI RAYSHON GRUDZINSKI is a 28 y.o. male.     HPI   28 year old male presents today with complaints of epistaxis.  Patient notes that this morning while taking a shower he had a bleed from his left anterior nose.  He notes direct pressure for approximately 5 minutes discontinued the bleed.  He denies any other abnormal bruising or bleeding.  He notes he is on aspirin daily but not on any anticoagulants.  He denies any recent trauma to the nose, denies any recent infections.    Past Medical History:  Diagnosis Date  . Albinism (HCC)   . Hypoglycemic reaction   . Legally blind   . Nystagmus   . PFO (patent foramen ovale) 09/16/2015   a. s/p placement of 25 mm Amplatzer PFO occluder  . Stroke [facility])     Patient Active Problem List   Diagnosis Date Noted  . History of stroke 02/09/2018  . PFO (patent foramen ovale) 09/16/2015  . 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 Albinism (HCC) 04/22/2015  . HLD (hyperlipidemia)   . CVA (cerebral infarction) 09/29/2014  . Stroke (HCC) 09/29/2014  . Numbness   . Marijuana abuse     Past Surgical History:  Procedure Laterality Date  . CARDIAC CATHETERIZATION N/A 09/16/2015   Procedure: ASD/VSD Closure;  Surgeon: Tonny Bollman, MD;  Location: [facility] INVASIVE CV LAB;  Service: Cardiovascular;  Laterality: N/A;  . EYE SURGERY     1997, 2010  . TEE WITHOUT CARDIOVERSION N/A 09/30/2014   Procedure: TRANSESOPHAGEAL ECHOCARDIOGRAM (TEE);  Surgeon: Wendall Stade, MD;  Location: [facility] ENDOSCOPY;  Service: Cardiovascular;  Laterality: N/A;        Home Medications    Prior to Admission medications   Medication Sig Start Date End Date Taking? Authorizing Provider  aspirin EC 81 MG tablet Take 81 mg by mouth daily.    [provider]    EPINEPHrine (EPIPEN 2-PAK) 0.3 mg/0.3 mL IJ SOAJ injection Inject 0.3 mLs (0.3 mg total) into the muscle once as needed for up to 1 dose (for severe allergic reaction). CAll 911 immediately if you have to use this medicine 06/15/17   Roxy Horseman, PA-C    Family History Family History  Problem Relation Age of Onset  . Healthy Mother   . Healthy Father   . Diabetes Paternal Grandfather   . Prostate cancer Paternal Grandfather   . Hypertension Paternal Grandfather   . Diabetes Paternal Grandmother   . Hypertension Paternal Grandmother   . Cirrhosis Maternal Grandmother   . Alcoholism Maternal Grandmother     Social History Social History   Tobacco Use  . Smoking status: Former Smoker    Types: Pipe  . Smokeless tobacco: Never Used  . Tobacco comment: Hookah  Substance Use Topics  . Alcohol use: Yes    Alcohol/week: 1.0 standard drinks    Types: 1 Cans of beer per week    Comment: Few beers per week.  . Drug use: No    Types: Marijuana    Comment: Last used in May 2016.     Allergies   Amoxicillin and Penicillins   Review of Systems Review of Systems  All other systems reviewed and are negative.    Physical Exam Updated Vital Signs BP 126/75 (BP  Location: Right Arm)   Pulse 72   Temp 98 F (36.7 C) (Oral)   Resp 18   SpO2 97%   Physical Exam Vitals signs and nursing note reviewed.  Constitutional:      Appearance: He is well-developed.  HENT:     Head: Normocephalic and atraumatic.     Comments: Small amount of irritation on the left nasal septum-no active bleeding  Oropharynx clear no lesions, no gingival bleeding Eyes:     General: No scleral icterus.       Right eye: No discharge.        Left eye: No discharge.     Conjunctiva/sclera: Conjunctivae normal.     Pupils: Pupils are equal, round, and reactive to light.  Neck:     Musculoskeletal: Normal range of motion.     Vascular: No JVD.     Trachea: No tracheal deviation.  Pulmonary:      Effort: Pulmonary effort is normal.     Breath sounds: No stridor.  Skin:    Comments: No bruising  Neurological:     Mental Status: He is alert and oriented to person, place, and time.     Coordination: Coordination normal.  Psychiatric:        Behavior: Behavior normal.        Thought Content: Thought content normal.        Judgment: Judgment normal.      ED Treatments / Results  Labs (all labs ordered are listed, but only abnormal results are displayed) Labs Reviewed - No data to display  EKG None  Radiology No results found.  Procedures Procedures (including critical care time)  Medications Ordered in ED Medications - No data to display   Initial Impression / Assessment and Plan / ED Course  I have reviewed the triage vital signs and the nursing notes.  Pertinent labs & imaging results that were available during my care of the patient were reviewed by me and considered in my medical decision making (see chart for details).        28 year old male presents today with epistaxis.  No bleeding presently, no signs of significant blood loss.  Patient will be discharged with return precautions the event bleeding recurs.  Both the patient his mother verbalized understanding and agreement to today's plan.  Final Clinical Impressions(s) / ED Diagnoses   Final diagnoses:  Epistaxis    ED Discharge Orders    None       Rosalio Loud 07/31/18 1005    Gwyneth Sprout, MD 08/03/18 [phone]

## 2019-02-12 NOTE — Progress Notes (Signed)
Cardiology Office Note    Date:  02/14/2019   ID:  Jeffrey Gilmore, DOB May 18, 1991, MRN [id number]  PCP:  Jeffrey Mask, MD  Cardiologist:  Jeffrey Bollman, MD  Electrophysiologist:  None   Chief Complaint: 1 year f/u PFO closure  History of Present Illness:   Jeffrey Gilmore is a 28 y.o. 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 He was last seen by Dr. Excell Gilmore on 05/02/2016 at which time he was stable from a cardiac standpoint.  Per Dr. Excell Gilmore, he'd undergone an echocardiogram prior to his visit showing normal position of the PFO occluder with no intracardiac level shunt. There were very few late bubbles in the left heart potentially indicative of pulmonary AVMs. His clopidogrel was stopped and he was continued on aspirin 81 mg lifelong. He was on simvastatin for about a year after his stroke but it was stopped in 2017. He was seen 02/09/18 by Jeffrey Gilmore for an acute visit for exertional chest tightness and shortness of breath. He was urged to do so by his girlfriend. He was under a lot of stress. 2D echocardiogram 02/15/18 showed EF 55-60%, normal diastolic function, normal ASD closure with no R-L atrial shunting. His labs showed normal CBC/BMET. No further workup was recommended at that time.  He is seen back for for follow-up today. He indicates he was due for his yearly f/u but also wanted to report back on the symptoms he had last year. He does not really describe chest discomfort anymore but has continued to have exertional dyspnea since then. It is stable but persistent from last year. He indicates that he himself would not necessarily have considered it abnormal but his fiance remains concerned it might be abnormal. The example he gives is getting out of breath and hot after climbing 3 flights of stairs. He has a 28 year old at home and is active with them but doesn't  formally exercise otherwise.   Past Medical History:  Diagnosis Date   Albinism [facility])    Hypoglycemic reaction    Legally blind    Nystagmus    PFO (patent foramen ovale) 09/16/2015   a. s/p placement of 25 mm Amplatzer PFO occluder   Stroke [facility])     Past Surgical History:  Procedure Laterality Date   CARDIAC CATHETERIZATION N/A 09/16/2015   Procedure: ASD/VSD Closure;  Surgeon: Jeffrey Bollman, MD;  Location: [facility] INVASIVE CV LAB;  Service: Cardiovascular;  Laterality: N/A;   EYE SURGERY     1997, 2010   TEE WITHOUT CARDIOVERSION N/A 09/30/2014   Procedure: TRANSESOPHAGEAL ECHOCARDIOGRAM (TEE);  Surgeon: Jeffrey Stade, MD;  Location: [facility] ENDOSCOPY;  Service: Cardiovascular;  Laterality: N/A;    Current Medications: No outpatient medications have been marked as taking for the 02/14/19 encounter (Office Visit) with Jeffrey Montana, PA-C.      Allergies:   Amoxicillin and Penicillins   Social History   Socioeconomic History   Marital status: Single    Spouse name: Not on file   Number of children: 0   Years of education: 12   Highest education level: Not on file  Occupational History   Occupation: Cook   Occupation: Consulting civil engineer strain: Not on file   Food insecurity    Worry: Not on file    Inability: Not on file   Transportation needs  Medical: Not on file    Non-medical: Not on file  Tobacco Use   Smoking status: Former Smoker    Types: Pipe   Smokeless tobacco: Never Used   Tobacco comment: Hookah  Substance and Sexual Activity   Alcohol use: Yes    Alcohol/week: 1.0 standard drinks    Types: 1 Cans of beer per week    Comment: Few beers per week.   Drug use: No    Types: Marijuana    Comment: Last used in May 2016.   Sexual activity: Yes    Birth control/protection: None  Lifestyle   Physical activity    Days per week: Not on file    Minutes per session: Not on file   Stress: Not on file   Relationships   Social connections    Talks on phone: Not on file    Gets together: Not on file    Attends religious service: Not on file    Active member of club or organization: Not on file    Attends meetings of clubs or organizations: Not on file    Relationship status: Not on file  Other Topics Concern   Not on file  Social History Narrative   Lives at home with his brother.   Left-handed.   1 cup caffeine per day.     Family History:  The patient's family history includes Alcoholism in his maternal grandmother; Cirrhosis in his maternal grandmother; Diabetes in his paternal grandfather and paternal grandmother; Healthy in his father and mother; Hypertension in his paternal grandfather and paternal grandmother; Prostate cancer in his paternal grandfather.  ROS:   Please see the history of present illness.  All other systems are reviewed and otherwise negative.    EKGs/Labs/Other Studies Reviewed:    Studies reviewed were summarized above.   EKG:  EKG is ordered today, personally reviewed, demonstrating NSR 67bpm, no acute STT changes  Recent Labs: No results found for requested labs within last 8760 hours.  Recent Lipid Panel    Component Value Date/Time   CHOL 134 05/02/2016 0908   TRIG 62 05/02/2016 0908   HDL 52 05/02/2016 0908   CHOLHDL 2.6 05/02/2016 0908   VLDL 12 05/02/2016 0908   LDLCALC 70 05/02/2016 0908    PHYSICAL EXAM:    VS:  BP 138/68    Pulse 66    Ht 6\' 1"  (1.854 m)    Wt 211 lb 1.9 oz (95.8 kg)    SpO2 97%    BMI 27.85 kg/m   BMI: Body mass index is 27.85 kg/m.  GEN: Well nourished, well developed M in no acute distress, albinism HEENT: normocephalic, atraumatic, lateral nystagmus+ Neck: no JVD, carotid bruits, or masses Cardiac: RRR; no murmurs, rubs, or gallops, no edema  Respiratory:  clear to auscultation bilaterally, normal work of breathing GI: soft, nontender, nondistended, + BS MS: no deformity or atrophy Skin: warm and dry,  no rash Neuro:  Alert and Oriented x 3, Strength and sensation are intact, follows commands Psych: euthymic mood, full affect  Wt Readings from Last 3 Encounters:  02/14/19 211 lb 1.9 oz (95.8 kg)  02/09/18 208 lb 4 oz (94.5 kg)  05/02/16 188 lb 6.4 oz (85.5 kg)     ASSESSMENT & PLAN:   1. Exertional dyspnea - same as last year without significant change - echo was stable at that time. Family is concerned but he believes it is likely derived from lack of physical conditioning. He has gradually gained  weight since his stroke. Will proceed with ETT for risk stratification. If normal do not anticipate further cardiac workup. This would allow [place] to give a prescription for exercise. If his dyspnea persisted despite regular exercise, could consider pulmonary referral. Also need to watch BP response with exercise. Of note he is technically legally blind but has a reasonable amount of site and is familiar with working out/treadmills in the past. 2. PFO closure - stable by echocardiogram last fall. Continue lifelong ASA. 3. Elevated blood pressure reading without dx of HTN - reviewed with patient. The patient was instructed to monitor their blood pressure at home and to call if tending to run higher than 130/80 at home. Will also assess response to exercise. 4. History of stroke - continue ASA 81mg  daily.  Disposition: F/u with Dr. in 1 year (sooner if ETT is abnormal).  Medication Adjustments/Labs and Tests Ordered: Current medicines are reviewed at length with the patient today.  Concerns regarding medicines are outlined above. Medication changes, Labs and Tests ordered today are summarized above and listed in the Patient Instructions accessible in Encounters.   Signed, Jeffrey Seltzer, PA-C  02/14/2019 3:21 PM    [facility] [address] [place], [place], [place]  [place] Phone: [phone]; Fax: [phone]

## 2019-02-14 ENCOUNTER — Encounter (INDEPENDENT_AMBULATORY_CARE_PROVIDER_SITE_OTHER): Payer: Self-pay

## 2019-02-14 ENCOUNTER — Ambulatory Visit (INDEPENDENT_AMBULATORY_CARE_PROVIDER_SITE_OTHER): Payer: Medicaid Other | Admitting: Physician Assistant

## 2019-02-14 ENCOUNTER — Encounter: Payer: Self-pay | Admitting: Physician Assistant

## 2019-02-14 ENCOUNTER — Other Ambulatory Visit: Payer: Self-pay

## 2019-02-14 ENCOUNTER — Encounter: Payer: Self-pay | Admitting: *Deleted

## 2019-02-14 VITALS — BP 138/68 | HR 66 | Ht 73.0 in | Wt 211.1 lb

## 2019-02-14 DIAGNOSIS — Z8774 Personal history of (corrected) congenital malformations of heart and circulatory system: Secondary | ICD-10-CM

## 2019-02-14 DIAGNOSIS — Z8673 Personal history of transient ischemic attack (TIA), and cerebral infarction without residual deficits: Secondary | ICD-10-CM

## 2019-02-14 DIAGNOSIS — R0609 Other forms of dyspnea: Secondary | ICD-10-CM | POA: Diagnosis not present

## 2019-02-14 DIAGNOSIS — R030 Elevated blood-pressure reading, without diagnosis of hypertension: Secondary | ICD-10-CM | POA: Diagnosis not present

## 2019-02-14 NOTE — Patient Instructions (Addendum)
Medication Instructions:  Your physician recommends that you continue on your current medications as directed. Please refer to the Current Medication list given to you today.  If you need a refill on your cardiac medications before your next appointment, please call your pharmacy.   Lab work: None ordered  If you have labs (blood work) drawn today and your tests are completely normal, you will receive your results only by: Marland Kitchen MyChart Message (if you have MyChart) OR . A paper copy in the mail If you have any lab test that is abnormal or we need to change your treatment, we will call you to review the results.  Testing/Procedures: Your physician has requested that you have an exercise tolerance test. For further information please visit [email or web address]. Please also follow instruction sheet, as given. In order to have this stress test, you will be required to get covid19 testing.  Someone will call you to arrange this.    Follow-Up: At [facility], you and your health needs are our priority.  As part of our continuing mission to provide you with exceptional heart care, we have created designated Provider Care Teams.  These Care Teams include your primary Cardiologist (physician) and Advanced Practice Providers (APPs -  Physician Assistants and Nurse Practitioners) who all work together to provide you with the care you need, when you need it. You will need a follow up appointment in:  12 months.  Please call our office 2 months in advance to schedule this appointment.  You may see Sherren Mocha, MD or one of the following Advanced Practice Providers on your designated Care Team: Richardson Dopp, PA-C [place], [place] . Daune Perch, NP  Any Other Special Instructions Will Be Listed Below (If Applicable). Please monitor your blood pressure occasionally at home. Call us or send in a MyChart message if you notice your readings tend to be greater than 130 on the top number or 80 on the  bottom number.    Cardiopulmonary Exercise Stress Test Cardiopulmonary exercise testing (CPET) is a test that checks how the heart and lungs react to exercise. This is called "exercise capacity." During this test, you will walk or run on a treadmill or pedal on a stationary bike. As you walk or run, tests will be done on your heart and lungs. You may have this test to:  Find out why you are short of breath.  Check for exercise intolerance.  See how your lungs work.  See how your heart works.  Check whether your heart or lungs are responding to treatments.  Check if you have a heart or lung problem.  Check if you are healthy enough to have surgery. Tell your doctor about:  Any allergies you have.  All medicines you are taking.  Any problems you or family members have had with anesthetic medicines.  Any blood disorders you have.  Any surgeries you have had.  Any medical conditions you have.  Whether you are pregnant or may be pregnant. What are the risks? Generally, this is a safe test. However, problems may occur, including:  Chest pain.  Shortness of breath.  Leg pain.  Irregular heartbeat. What happens before the procedure?  Follow instructions from your doctor about what you cannot eat or drink.  Ask your doctor about changing or stopping your normal medicines.  Wear loose, comfortable clothing and shoes.  If you use an inhaler, bring it with you to the test. What happens during the procedure?   A blood pressure  cuff will be placed on your arm.  Stick-on patches (electrodes) will be placed on your chest. They will be attached to an EKG machine.  A clip-on monitor that shows the amount of oxygen in your blood will be placed on your finger (pulse oximeter).  A clip will be placed on your nose and a mouthpiece will be placed in your mouth. This may be held in place with a headpiece. You will breathe through the mouthpiece during the test.  You will be  asked to start exercising. You will be closely watched while you exercise.  The amount of effort for your exercise will be slowly increased.  During exercise, the test will measure: ? Your heart rate. ? Your heart rhythm. ? Your oxygen blood level. ? The amount of oxygen and carbon dioxide that you breathe out.  The test will end when: ? You have finished the test. ? You have reached your maximum ability to exercise. ? You have chest or leg pain, dizziness, or shortness of breath. The test may vary among doctors and hospitals. What can I expect after the test? Your blood pressure, heart rate, breathing rate, and blood oxygen level will be monitored until you leave the hospital or clinic. Summary  Cardiopulmonary exercise testing (CPET) is a test that checks how your heart and lungs react to exercise.  Follow your doctor's instructions about food and drink, and what medicines to change or stop.  During this test, you will walk or run on a treadmill or pedal on a stationary bike. Tests will be done as you run or walk. This information is not intended to replace advice given to you by your health care provider. Make sure you discuss any questions you have with your health care provider. Document Released: 04/27/2009 Document Revised: 07/26/2018 Document Reviewed: 01/11/2018 Elsevier Patient Education  2020 [employer].

## 2019-02-19 ENCOUNTER — Encounter (HOSPITAL_COMMUNITY): Payer: Self-pay | Admitting: Physician Assistant

## 2019-07-31 ENCOUNTER — Telehealth: Payer: Self-pay | Admitting: Cardiovascular Disease

## 2019-07-31 NOTE — Telephone Encounter (Signed)
  Pt called and would like to know if there's any allergy medications he can take with his blood thinner and heart medications.  Please advise

## 2019-07-31 NOTE — Telephone Encounter (Signed)
Informed the patient he may use OTC Claritin or Zyrtec. He was grateful for assistance.

## 2019-08-01 ENCOUNTER — Ambulatory Visit: Payer: Medicaid Other | Attending: Internal Medicine

## 2019-08-01 DIAGNOSIS — Z23 Encounter for immunization: Secondary | ICD-10-CM

## 2019-08-01 NOTE — Progress Notes (Signed)
   Covid-19 Vaccination Clinic  Name:  Jeffrey Gilmore    MRN: [id number] DOB: [birth year]  08/01/2019  Mr. Jeffrey Gilmore was observed post Covid-19 immunization for 30 minutes based on pre-vaccination screening without incident. He was provided with Vaccine Information Sheet and instruction to access the V-Safe system.   Mr. Jeffrey Gilmore was instructed to call 911 with any severe reactions post vaccine: Marland Kitchen Difficulty breathing  . Swelling of face and throat  . A fast heartbeat  . A bad rash all over body  . Dizziness and weakness   Immunizations Administered    Name Date Dose VIS Date Route   Moderna COVID-19 Vaccine 08/01/2019 11:02 AM 0.5 mL 04/23/2019 Intramuscular   Manufacturer: Moderna   Lot: [id number]   NDC: 87183-672-55

## 2019-09-03 ENCOUNTER — Ambulatory Visit: Payer: Medicaid Other | Attending: Internal Medicine

## 2019-09-03 DIAGNOSIS — Z23 Encounter for immunization: Secondary | ICD-10-CM

## 2019-09-03 NOTE — Progress Notes (Signed)
   Covid-19 Vaccination Clinic  Name:  Jeffrey Gilmore    MRN: [id number] DOB: December 26, 1990  09/03/2019  Jeffrey Gilmore was observed post Covid-19 immunization for 30 minutes based on pre-vaccination screening without incident. He was provided with Vaccine Information Sheet and instruction to access the V-Safe system.   Jeffrey Gilmore was instructed to call 911 with any severe reactions post vaccine: Marland Kitchen Difficulty breathing  . Swelling of face and throat  . A fast heartbeat  . A bad rash all over body  . Dizziness and weakness   Immunizations Administered    Name Date Dose VIS Date Route   Moderna COVID-19 Vaccine 09/03/2019  9:26 AM 0.5 mL 04/23/2019 Intramuscular   Manufacturer: Gala Murdoch   Lot: [id number]   NDC: 80777-273-99      Covid-19 Vaccination Clinic  Name:  Jeffrey Gilmore    MRN: [id number] DOB: 1991/01/30  09/03/2019  Jeffrey Gilmore was observed post Covid-19 immunization for 30 minutes based on pre-vaccination screening without incident. He was provided with Vaccine Information Sheet and instruction to access the V-Safe system.   Jeffrey Gilmore was instructed to call 911 with any severe reactions post vaccine: Marland Kitchen Difficulty breathing  . Swelling of face and throat  . A fast heartbeat  . A bad rash all over body  . Dizziness and weakness   Immunizations Administered    Name Date Dose VIS Date Route   Moderna COVID-19 Vaccine 09/03/2019  9:26 AM 0.5 mL 04/23/2019 Intramuscular   Manufacturer: Moderna   Lot: [id number]   NDC: 19379-024-09

## 2021-05-10 ENCOUNTER — Telehealth: Payer: Medicaid Other | Admitting: Family Medicine

## 2021-05-10 DIAGNOSIS — J029 Acute pharyngitis, unspecified: Secondary | ICD-10-CM

## 2021-05-10 MED ORDER — AZITHROMYCIN 250 MG PO TABS: ORAL_TABLET | ORAL | 0 refills | Status: AC

## 2021-05-10 NOTE — Progress Notes (Signed)
E-Visit for Sore Throat - Strep Symptoms  We are sorry that you are not feeling well.  Here is how we plan to help!  Based on what you have shared with me it is likely that you have strep pharyngitis.  Strep pharyngitis is inflammation and infection in the back of the throat.  This is an infection cause by bacteria and is treated with antibiotics.  I have prescribed Azithromycin 250 mg two tablets today and then one daily for 4 additional days. For throat pain, we recommend over the counter oral pain relief medications such as acetaminophen or aspirin, or anti-inflammatory medications such as ibuprofen or naproxen sodium. Topical treatments such as oral throat lozenges or sprays may be used as needed. Strep infections are not as easily transmitted as other respiratory infections, however we still recommend that you avoid close contact with loved ones, especially the very young and elderly.  Remember to wash your hands thoroughly throughout the day as this is the number one way to prevent the spread of infection and wipe down door knobs and counters with disinfectant.   Home Care: Only take medications as instructed by your medical team. Complete the entire course of an antibiotic. Do not take these medications with alcohol. A steam or ultrasonic humidifier can help congestion.  You can place a towel over your head and breathe in the steam from hot water coming from a faucet. Avoid close contacts especially the very young and the elderly. Cover your mouth when you cough or sneeze. Always remember to wash your hands.  Get Help Right Away If: You develop worsening fever or sinus pain. You develop a severe head ache or visual changes. Your symptoms persist after you have completed your treatment plan.  Make sure you Understand these instructions. Will watch your condition. Will get help right away if you are not doing well or get worse.   Thank you for choosing an e-visit.  Your e-visit  answers were reviewed by a board certified advanced clinical practitioner to complete your personal care plan. Depending upon the condition, your plan could have included both over the counter or prescription medications.  Please review your pharmacy choice. Make sure the pharmacy is open so you can pick up prescription now. If there is a problem, you may contact your provider through MyChart messaging and have the prescription routed to another pharmacy.  Your safety is important to us. If you have drug allergies check your prescription carefully.   For the next 24 hours you can use MyChart to ask questions about today's visit, request a non-urgent call back, or ask for a work or school excuse. You will get an email in the next two days asking about your experience. I hope that your e-visit has been valuable and will speed your recovery.  I provided 5 minutes of non face-to-face time during this encounter for chart review, medication and order placement, as well as and documentation.    

## 2021-05-28 ENCOUNTER — Telehealth: Payer: Medicaid Other | Admitting: Physician Assistant

## 2021-05-28 DIAGNOSIS — J019 Acute sinusitis, unspecified: Secondary | ICD-10-CM | POA: Diagnosis not present

## 2021-05-28 DIAGNOSIS — B9789 Other viral agents as the cause of diseases classified elsewhere: Secondary | ICD-10-CM

## 2021-05-28 MED ORDER — IPRATROPIUM BROMIDE 0.03 % NA SOLN: 2.0000 | Freq: Two times a day (BID) | NASAL | 0 refills | Status: AC

## 2021-05-28 NOTE — Progress Notes (Signed)

## 2022-03-07 ENCOUNTER — Telehealth: Payer: Medicaid Other | Admitting: Family

## 2022-03-07 DIAGNOSIS — J069 Acute upper respiratory infection, unspecified: Secondary | ICD-10-CM

## 2022-03-07 MED ORDER — FLUTICASONE PROPIONATE 50 MCG/ACT NA SUSP: 2.0000 | Freq: Every day | NASAL | 6 refills | Status: AC

## 2022-03-07 MED ORDER — BENZONATATE 100 MG PO CAPS: 100.0000 mg | ORAL_CAPSULE | Freq: Three times a day (TID) | ORAL | 0 refills | Status: AC | PRN

## 2022-03-07 NOTE — Progress Notes (Signed)
